# Patient Record
Sex: Male | Born: 1965 | Race: Black or African American | Hispanic: No | Marital: Married | Smoking: Never smoker
Health system: Southern US, Community
[De-identification: ages and names within clinical notes are randomized; demographics above are authoritative.]

## PROBLEM LIST (undated history)

## (undated) DIAGNOSIS — M62830 Muscle spasm of back: Secondary | ICD-10-CM

## (undated) DIAGNOSIS — M25519 Pain in unspecified shoulder: Secondary | ICD-10-CM

## (undated) HISTORY — PX: RETINAL TEAR REPAIR CRYOTHERAPY: SHX5304

## (undated) HISTORY — PX: SHOULDER SURGERY: SHX246

---

## 1999-12-29 ENCOUNTER — Encounter: Payer: Self-pay | Admitting: Emergency Medicine

## 1999-12-29 ENCOUNTER — Emergency Department (HOSPITAL_COMMUNITY): Admission: EM | Admit: 1999-12-29 | Discharge: 1999-12-29 | Payer: Self-pay | Admitting: Emergency Medicine

## 2009-06-16 ENCOUNTER — Emergency Department (HOSPITAL_COMMUNITY): Admission: EM | Admit: 2009-06-16 | Discharge: 2009-06-16 | Payer: Self-pay | Admitting: Emergency Medicine

## 2010-07-19 ENCOUNTER — Emergency Department (HOSPITAL_COMMUNITY)
Admission: EM | Admit: 2010-07-19 | Discharge: 2010-07-19 | Disposition: A | Discharge status: Home / Self Care | Payer: Self-pay | Attending: Emergency Medicine | Admitting: Emergency Medicine

## 2010-07-19 ENCOUNTER — Emergency Department (HOSPITAL_COMMUNITY): Payer: Self-pay

## 2010-07-19 DIAGNOSIS — H538 Other visual disturbances: Secondary | ICD-10-CM | POA: Insufficient documentation

## 2010-07-19 DIAGNOSIS — R0602 Shortness of breath: Secondary | ICD-10-CM | POA: Insufficient documentation

## 2010-07-19 DIAGNOSIS — F411 Generalized anxiety disorder: Secondary | ICD-10-CM | POA: Insufficient documentation

## 2010-07-19 DIAGNOSIS — R209 Unspecified disturbances of skin sensation: Secondary | ICD-10-CM | POA: Insufficient documentation

## 2010-07-19 DIAGNOSIS — I498 Other specified cardiac arrhythmias: Secondary | ICD-10-CM | POA: Insufficient documentation

## 2010-07-19 DIAGNOSIS — R0789 Other chest pain: Secondary | ICD-10-CM | POA: Insufficient documentation

## 2010-07-19 DIAGNOSIS — R110 Nausea: Secondary | ICD-10-CM | POA: Insufficient documentation

## 2010-07-19 LAB — BASIC METABOLIC PANEL
BUN: 11 mg/dL (ref 6–23)
Calcium: 9.6 mg/dL (ref 8.4–10.5)
Creatinine, Ser: 0.83 mg/dL (ref 0.4–1.5)
GFR calc non Af Amer: >60 mL/min (ref >60)
Glucose, Bld: 112 mg/dL — ABNORMAL HIGH (ref 70–99)
Potassium: 3.7 mEq/L (ref 3.5–5.1)

## 2010-07-19 LAB — CBC
HCT: 38.6 % — ABNORMAL LOW (ref 39.0–52.0)
Hemoglobin: 14 g/dL (ref 13.0–17.0)
MCH: 31.3 pg (ref 26.0–34.0)
MCHC: 36.3 g/dL — ABNORMAL HIGH (ref 30.0–36.0)
MCV: 86.4 fL (ref 78.0–100.0)
Platelets: 228 10*3/uL (ref 150–400)
RBC: 4.47 MIL/uL (ref 4.22–5.81)
RDW: 12.4 % (ref 11.5–15.5)
WBC: 6.7 10*3/uL (ref 4.0–10.5)

## 2010-07-19 LAB — DIFFERENTIAL
Basophils Absolute: 0 10*3/uL (ref 0.0–0.1)
Basophils Relative: 0 % (ref 0–1)
Eosinophils Absolute: 0.1 10*3/uL (ref 0.0–0.7)
Eosinophils Relative: 1 % (ref 0–5)
Lymphocytes Relative: 43 % (ref 12–46)
Lymphs Abs: 2.9 10*3/uL (ref 0.7–4.0)
Monocytes Absolute: 0.3 10*3/uL (ref 0.1–1.0)
Monocytes Relative: 5 % (ref 3–12)
Neutro Abs: 3.4 10*3/uL (ref 1.7–7.7)
Neutrophils Relative %: 51 % (ref 43–77)

## 2010-07-19 LAB — POCT CARDIAC MARKERS
CKMB, poc: <1 ng/mL — ABNORMAL LOW (ref 1.0–8.0)
Myoglobin, poc: 55.3 ng/mL (ref 12–200)
Troponin i, poc: <0.05 ng/mL (ref 0.00–0.09)
Troponin i, poc: <0.05 ng/mL (ref 0.00–0.09)

## 2013-01-10 ENCOUNTER — Other Ambulatory Visit (HOSPITAL_COMMUNITY): Payer: Self-pay | Admitting: Family Medicine

## 2013-01-10 DIAGNOSIS — M25512 Pain in left shoulder: Secondary | ICD-10-CM

## 2013-01-10 DIAGNOSIS — R531 Weakness: Secondary | ICD-10-CM

## 2013-01-15 ENCOUNTER — Ambulatory Visit (HOSPITAL_COMMUNITY)
Admission: RE | Admit: 2013-01-15 | Discharge: 2013-01-15 | Disposition: A | Discharge status: Home / Self Care | Payer: Self-pay | Source: Ambulatory Visit | Attending: Family Medicine | Admitting: Family Medicine

## 2013-01-15 DIAGNOSIS — S43429A Sprain of unspecified rotator cuff capsule, initial encounter: Secondary | ICD-10-CM | POA: Insufficient documentation

## 2013-01-15 DIAGNOSIS — M25512 Pain in left shoulder: Secondary | ICD-10-CM

## 2013-01-15 DIAGNOSIS — X58XXXA Exposure to other specified factors, initial encounter: Secondary | ICD-10-CM | POA: Insufficient documentation

## 2013-01-15 DIAGNOSIS — R531 Weakness: Secondary | ICD-10-CM

## 2014-06-20 ENCOUNTER — Emergency Department (HOSPITAL_COMMUNITY): Payer: Self-pay

## 2014-06-20 ENCOUNTER — Emergency Department (HOSPITAL_COMMUNITY)
Admission: EM | Admit: 2014-06-20 | Discharge: 2014-06-20 | Disposition: A | Discharge status: Home / Self Care | Payer: Self-pay | Attending: Emergency Medicine | Admitting: Emergency Medicine

## 2014-06-20 ENCOUNTER — Encounter (HOSPITAL_COMMUNITY): Payer: Self-pay | Admitting: Emergency Medicine

## 2014-06-20 DIAGNOSIS — F419 Anxiety disorder, unspecified: Secondary | ICD-10-CM | POA: Insufficient documentation

## 2014-06-20 DIAGNOSIS — R0602 Shortness of breath: Secondary | ICD-10-CM | POA: Insufficient documentation

## 2014-06-20 DIAGNOSIS — R61 Generalized hyperhidrosis: Secondary | ICD-10-CM | POA: Insufficient documentation

## 2014-06-20 DIAGNOSIS — R079 Chest pain, unspecified: Secondary | ICD-10-CM

## 2014-06-20 LAB — CBC
HCT: 38.6 % — ABNORMAL LOW (ref 39.0–52.0)
HEMOGLOBIN: 13.3 g/dL (ref 13.0–17.0)
MCH: 30.4 pg (ref 26.0–34.0)
MCHC: 34.5 g/dL (ref 30.0–36.0)
MCV: 88.3 fL (ref 78.0–100.0)
PLATELETS: 214 10*3/uL (ref 150–400)
RBC: 4.37 MIL/uL (ref 4.22–5.81)
RDW: 12.7 % (ref 11.5–15.5)
WBC: 5.6 10*3/uL (ref 4.0–10.5)

## 2014-06-20 LAB — BRAIN NATRIURETIC PEPTIDE: B NATRIURETIC PEPTIDE 5: 10.7 pg/mL (ref 0.0–100.0)

## 2014-06-20 LAB — I-STAT TROPONIN, ED: Troponin i, poc: 0 ng/mL (ref 0.00–0.08)

## 2014-06-20 LAB — BASIC METABOLIC PANEL
Anion gap: 7 (ref 5–15)
BUN: 13 mg/dL (ref 6–23)
CALCIUM: 9.3 mg/dL (ref 8.4–10.5)
CHLORIDE: 102 mmol/L (ref 96–112)
CO2: 29 mmol/L (ref 19–32)
CREATININE: 1.09 mg/dL (ref 0.50–1.35)
GFR calc Af Amer: >90 mL/min (ref >90)
GFR calc non Af Amer: 80 mL/min — ABNORMAL LOW (ref >90)
GLUCOSE: 130 mg/dL — AB (ref 70–99)
POTASSIUM: 3.9 mmol/L (ref 3.5–5.1)
SODIUM: 138 mmol/L (ref 135–145)

## 2014-06-20 NOTE — Discharge Instructions (Signed)

## 2014-06-20 NOTE — ED Notes (Signed)
Patient was seen by MD and told her was going to be discharged.  Upon entering the room patient was gone.

## 2014-06-20 NOTE — ED Notes (Signed)
Pt. reports intermittent central chest pain with SOB and nausea onset last week , denies  cough or congestion .

## 2014-06-20 NOTE — ED Provider Notes (Signed)
CSN: [id number]     Arrival date & time 06/20/14  2147 History   First MD Initiated Contact with Patient 06/20/14 2231     Chief Complaint  Patient presents with  . Chest Pain     (Consider location/radiation/quality/duration/timing/severity/associated sxs/prior Treatment) HPI  49 year old male presents with a history of approximate 30 minutes of chest pain. He states it started while he was working underneath his wife's car. He states he mostly felt shortness of breath with a little bit of sharp chest pain. Short of breath is similar to prior anxiety attacks in the past. He has anxiety issues daily, has severe anxiety issues about once per month like this. Given the chest pain, he became concerned and came to the ER. Patient denies any leg pain, leg swelling, or radiation of pain. Patient states the pain is been gone for about an hour. He now feels normal. He thinks this is all related to his anxiety. No prior history of hypertension, diabetes, hyperlipidemia, or smoking. His dad had an MI when he was 50.  History reviewed. No pertinent past medical history. Past Surgical History  Procedure Laterality Date  . Shoulder surgery     No family history on file. History  Substance Use Topics  . Smoking status: Never Smoker   . Smokeless tobacco: Not on file  . Alcohol Use: No    Review of Systems  Constitutional: Positive for diaphoresis. Negative for fever.  Respiratory: Positive for shortness of breath. Negative for cough.   Cardiovascular: Positive for chest pain. Negative for leg swelling.  Gastrointestinal: Negative for nausea, vomiting and abdominal pain.  Musculoskeletal: Negative for myalgias.  Psychiatric/Behavioral: The patient is nervous/anxious.   All other systems reviewed and are negative.     Allergies  Review of patient's allergies indicates no known allergies.  Home Medications   Prior to Admission medications   Medication Sig Start Date End Date Taking?  Authorizing Provider  HYDROcodone-acetaminophen (NORCO) 10-325 MG per tablet Take 1 tablet by mouth every 6 (six) hours as needed for moderate pain.   Yes Historical Provider, MD   BP 147/101 mmHg  Pulse 86  Temp(Src) 98.7 F (37.1 C) (Oral)  Resp 20  Ht 5\' 11"  (1.803 m)  Wt 175 lb (79.379 kg)  BMI 24.42 kg/m2  SpO2 100% Physical Exam  Constitutional: He is oriented to person, place, and time. He appears well-developed and well-nourished.  HENT:  Head: Normocephalic and atraumatic.  Right Ear: External ear normal.  Left Ear: External ear normal.  Nose: Nose normal.  Eyes: Right eye exhibits no discharge. Left eye exhibits no discharge.  Neck: Neck supple.  Cardiovascular: Normal rate, regular rhythm, normal heart sounds and intact distal pulses.   Pulmonary/Chest: Effort normal and breath sounds normal. He exhibits tenderness.    Abdominal: Soft. He exhibits no distension. There is no tenderness.  Musculoskeletal: He exhibits no edema.  Neurological: He is alert and oriented to person, place, and time.  Skin: Skin is warm and dry.  Nursing note and vitals reviewed.   ED Course  Procedures (including critical care time) Labs Review Labs Reviewed  CBC - Abnormal; Notable for the following:    HCT 38.6 (*)    All other components within normal limits  BASIC METABOLIC PANEL - Abnormal; Notable for the following:    Glucose, Bld 130 (*)    GFR calc non Af Amer 80 (*)    All other components within normal limits  BRAIN NATRIURETIC PEPTIDE  I-STAT  TROPOININ, ED    Imaging Review Dg Chest 2 View  06/20/2014   CLINICAL DATA:  49 year old male with new onset chest pain  EXAM: CHEST  2 VIEW  COMPARISON:  Prior chest x-ray 07/19/2010  FINDINGS: The lungs are clear and negative for focal airspace consolidation, pulmonary edema or suspicious pulmonary nodule. No pleural effusion or pneumothorax. Cardiac and mediastinal contours are within normal limits. No acute fracture or lytic  or blastic osseous lesions. The visualized upper abdominal bowel gas pattern is unremarkable.  IMPRESSION: Negative chest x-ray.   Electronically Signed   By: Malachy Moan M.D.   On: 06/20/2014 22:28     EKG Interpretation   Date/Time:  Thursday June 20 2014 21:52:34 EST Ventricular Rate:  92 PR Interval:  138 QRS Duration: 80 QT Interval:  396 QTC Calculation: 489 R Axis:   44 Text Interpretation:  Normal sinus rhythm Prolonged QT Abnormal ECG no  acute ST/T changes no significant change since 2012 Confirmed by Malvin Morrish   MD, Tiffany Calmes ([id number]) on 06/20/2014 10:31:46 PM      MDM   Final diagnoses:  Chest pain, unspecified chest pain type    Patient's symptoms sound consistent with anxiety versus very atypical chest pain. Initial EKG and troponin are normal. It is only been about 2 hours since his symptoms started, I recommended a second troponin to help rule out MI. At this point patient declines, stating he feels fine and wants to go home. I discussed the possibility of a missed MI and accommodation of this, including death. Patient understands these risks and wants to follow-up with his PCP tomorrow instead, stating he has a lot to do tomorrow. Given that I think this is low likelihood of ACS or feel is reasonable discharge with this plan, he will follow-up with PCP and will return if symptoms worsen.    Audree Camel, MD 06/20/14 [phone]

## 2014-06-20 NOTE — ED Notes (Signed)
Patient not in room when this nurse went to assess.  Clothing gone. Dr Criss AlvineGoldston notified.  Patient did no receive his discharge instructions

## 2014-11-11 ENCOUNTER — Emergency Department (HOSPITAL_COMMUNITY)
Admission: EM | Admit: 2014-11-11 | Discharge: 2014-11-11 | Disposition: A | Discharge status: Home / Self Care | Payer: Self-pay | Attending: Emergency Medicine | Admitting: Emergency Medicine

## 2014-11-11 ENCOUNTER — Encounter (HOSPITAL_COMMUNITY): Payer: Self-pay | Admitting: Family Medicine

## 2014-11-11 DIAGNOSIS — M5416 Radiculopathy, lumbar region: Secondary | ICD-10-CM | POA: Insufficient documentation

## 2014-11-11 DIAGNOSIS — R52 Pain, unspecified: Secondary | ICD-10-CM

## 2014-11-11 NOTE — Discharge Instructions (Signed)
Contact your primary care physician for follow-up and for further evaluation of your back pain and numbness.

## 2014-11-11 NOTE — ED Provider Notes (Addendum)
CSN: [id number]     Arrival date & time 11/11/14  1142 History   First MD Initiated Contact with Patient 11/11/14 1225     Chief Complaint  Patient presents with  . Leg Pain  . Numbness     (Consider location/radiation/quality/duration/timing/severity/associated sxs/prior Treatment) HPI Complains of right-sided paralumbar pain onset 3 days ago. Patient was not at work when pain began. He suffered a twisting injury while helping a friend. Pain radiates to right foot. Associated symptoms are He feels numbness in his right anterior thigh denies loss of bladder or bowel control. No fever. No direct trauma. He states he suffers similar pain every 2-3 months for the past year. He's been treated with Norco, with partial relief. No other associated symptoms History reviewed. No pertinent past medical history. Past Surgical History  Procedure Laterality Date  . Shoulder surgery     History reviewed. No pertinent family history. History  Substance Use Topics  . Smoking status: Never Smoker   . Smokeless tobacco: Not on file  . Alcohol Use: No   no illicit drug use  Review of Systems  Constitutional: Negative.   HENT: Negative.   Respiratory: Negative.   Cardiovascular: Negative.   Gastrointestinal: Negative.   Musculoskeletal: Positive for back pain.  Skin: Negative.   Neurological: Positive for numbness.  Psychiatric/Behavioral: Negative.   All other systems reviewed and are negative.     Allergies  Review of patient's allergies indicates no known allergies.  Home Medications   Prior to Admission medications   Medication Sig Start Date End Date Taking? Authorizing Provider  HYDROcodone-acetaminophen (NORCO) 10-325 MG per tablet Take 1 tablet by mouth every 6 (six) hours as needed for moderate pain.   Yes Historical Provider, MD   BP 134/89 mmHg  Pulse 82  Temp(Src) 98 F (36.7 C) (Oral)  Resp 22  SpO2 99% Physical Exam  Constitutional: He is oriented to person, place,  and time. He appears well-developed and well-nourished. No distress.  HENT:  Head: Normocephalic and atraumatic.  Eyes: Conjunctivae are normal. Pupils are equal, round, and reactive to light.  Neck: Neck supple. No tracheal deviation present. No thyromegaly present.  Cardiovascular: Normal rate and regular rhythm.   No murmur heard. Pulmonary/Chest: Effort normal and breath sounds normal.  Abdominal: Soft. Bowel sounds are normal. He exhibits no distension. There is no tenderness.  Musculoskeletal: Normal range of motion. He exhibits no edema or tenderness.  Tire spine nontender. He has pain at right paralumbar area when standing up from a supine position  Neurological: He is alert and oriented to person, place, and time. He has normal reflexes. Coordination normal.  Gait normal DTR symmetric bilaterally knee jerk ankle jerk biceps toes downward going bilaterally  Skin: Skin is warm and dry. No rash noted.  Psychiatric: He has a normal mood and affect.  Nursing note and vitals reviewed.   ED Course  Procedures (including critical care time) Labs Review Labs Reviewed - No data to display  Imaging Review No results found.   EKG Interpretation None      declines pain medicine presently  MDM   No signs of cauda equina syndrome. Patient may need MRI scan, non-emergently. He elected to apply for insurance and follow-up with primary care physician who will arrange for further evaluation Dx  lumbar radiculopathy Final diagnoses:  None        Doug Sou, MD 11/11/14 1443  Doug Sou, MD 11/11/14 1443

## 2014-11-11 NOTE — ED Notes (Signed)
Pt here for lower back pain and right leg numbness. sts no feeling in right thigh. sts work accident 3 days ago.

## 2014-11-19 ENCOUNTER — Encounter (HOSPITAL_COMMUNITY): Payer: Self-pay | Admitting: Emergency Medicine

## 2014-11-19 ENCOUNTER — Emergency Department (HOSPITAL_COMMUNITY)
Admission: EM | Admit: 2014-11-19 | Discharge: 2014-11-19 | Disposition: A | Discharge status: Home / Self Care | Payer: Self-pay | Attending: Emergency Medicine | Admitting: Emergency Medicine

## 2014-11-19 DIAGNOSIS — M5417 Radiculopathy, lumbosacral region: Secondary | ICD-10-CM | POA: Insufficient documentation

## 2014-11-19 DIAGNOSIS — Y92007 Garden or yard of unspecified non-institutional (private) residence as the place of occurrence of the external cause: Secondary | ICD-10-CM | POA: Insufficient documentation

## 2014-11-19 DIAGNOSIS — S61411A Laceration without foreign body of right hand, initial encounter: Secondary | ICD-10-CM | POA: Insufficient documentation

## 2014-11-19 DIAGNOSIS — W450XXA Nail entering through skin, initial encounter: Secondary | ICD-10-CM | POA: Insufficient documentation

## 2014-11-19 DIAGNOSIS — Y9389 Activity, other specified: Secondary | ICD-10-CM | POA: Insufficient documentation

## 2014-11-19 DIAGNOSIS — Y998 Other external cause status: Secondary | ICD-10-CM | POA: Insufficient documentation

## 2014-11-19 HISTORY — DX: Muscle spasm of back: M62.830

## 2014-11-19 HISTORY — DX: Pain in unspecified shoulder: M25.519

## 2014-11-19 MED ORDER — LIDOCAINE HCL 2 % IJ SOLN
10.0000 mL | Freq: Once | INTRAMUSCULAR | Status: AC
  Administered 2014-11-19: 200 mg via INTRADERMAL
  Filled 2014-11-19: qty 20

## 2014-11-19 MED ORDER — CYCLOBENZAPRINE HCL 10 MG PO TABS: 10.0000 mg | ORAL_TABLET | Freq: Two times a day (BID) | ORAL | Status: DC | PRN

## 2014-11-19 MED ORDER — NAPROXEN 500 MG PO TABS: 500.0000 mg | ORAL_TABLET | Freq: Two times a day (BID) | ORAL | Status: DC

## 2014-11-19 NOTE — ED Provider Notes (Signed)
CSN: [id number]     Arrival date & time 11/19/14  1200 History  This chart was scribed for non-physician practitioner Sherlene Shams, PA-C working with Rolland Porter, MD by Murriel Hopper, ED Scribe. This patient was seen in room WTR6/WTR6 and the patient's care was started at 12:52 PM.   Chief Complaint  Patient presents with  . Back Pain    acute low back pain  . Hand Injury    skin laceration, "rusty nail"    The history is provided by the patient. No language interpreter was used.   HPI Comments: Charles Dominguez is a 48 y.o. male who presents to the Emergency Department complaining of recurrent bilateral lower back pain with associated radiated pain down right bed and numbness that has been present for a few months. Pt states that every 4-6 weeks he has back pain and has not sought treatment for it yet. Pt states he cannot usually perform daily activities if his back is bothering him, and states that today he was working on a shed in his backyard, and hammering nails into the side of the shed. Pt states that his back had a spasm while he was hitting a nail, and caused his grip to slip and hammer the nail into the skin between his index finger and thumb on his right hand. Pt reports with a laceration and recurrent bleeding of the area.   No past medical history on file. Past Surgical History  Procedure Laterality Date  . Shoulder surgery     No family history on file. History  Substance Use Topics  . Smoking status: Never Smoker   . Smokeless tobacco: Not on file  . Alcohol Use: No    Review of Systems  Musculoskeletal: Positive for back pain and arthralgias.  Skin: Positive for wound.      Allergies  Review of patient's allergies indicates no known allergies.  Home Medications   Prior to Admission medications   Medication Sig Start Date End Date Taking? Authorizing Provider  HYDROcodone-acetaminophen (NORCO) 10-325 MG per tablet Take 1 tablet by mouth every 6  (six) hours as needed for moderate pain.    Historical Provider, MD   There were no vitals taken for this visit. Physical Exam  Constitutional: He is oriented to person, place, and time. He appears well-developed and well-nourished.  HENT:  Head: Normocephalic and atraumatic.  Neck: Neck supple.  Cardiovascular: Normal rate.   Pulmonary/Chest: Effort normal.  Abdominal: He exhibits no distension.  Musculoskeletal:  4 cm flap laceration in the web space between right thumb and index finger. Laceration appears to be superficial, gaping, no deep tissue involvement. Full range of motion, and all fingers in all directions, strength is 5/5  and equal at all joints. Sensation intact over all dermatomes of the hand and over dorsal and palmar surface of the thumb and index finger. Cap refill less than 2 seconds distally. No bony tenderness to the hand Midline and right perivertebral spine and muscle tenderness. Pain with right straight leg raise.  Neurological: He is alert and oriented to person, place, and time.  5/5 and equal lower extremity strength. 2+ and equal patellar reflexes bilaterally. Pt able to dorsiflex bilateral toes and feet with good strength against resistance. Equal sensation bilaterally over thighs and lower legs.   Skin: Skin is warm and dry.  Psychiatric: He has a normal mood and affect.  Nursing note and vitals reviewed.   ED Course  Procedures (including critical care time)  DIAGNOSTIC STUDIES: Oxygen Saturation is 100% on room air, normal by my interpretation.    COORDINATION OF CARE: 12:57 PM Discussed treatment plan with pt at bedside and pt agreed to plan.   Labs Review Labs Reviewed - No data to display  Imaging Review No results found.   EKG Interpretation None     LACERATION REPAIR Performed by: Lottie MusselKIRICHENKO, Lakai Moree A Authorized by: Jaynie CrumbleKIRICHENKO, Cordaro Mukai A Consent: Verbal consent obtained. Risks and benefits: risks, benefits and alternatives were  discussed Consent given by: patient Patient identity confirmed: provided demographic data Prepped and Draped in normal sterile fashion Wound explored  Laceration Location: right hand  Laceration Length: 4cm  No Foreign Bodies seen or palpated  Anesthesia: local infiltration  Local anesthetic: lidocaine 2% wo epinephrine  Anesthetic total: 2 ml  Irrigation method: syringe Amount of cleaning: standard  Skin closure: prolene 5.0  Number of sutures: 5  Technique: simple interrupted  Patient tolerance: Patient tolerated the procedure well with no immediate complications.  MDM   Final diagnoses:  Laceration of right hand, initial encounter  Lumbosacral radiculitis    Patient suffered laceration to the right hand. Laceration numbed and irrigated thoroughly, there is no soft tissue damage under the laceration. It appears to be superficial. There is no bony tenderness, full range of motion of all joints and normal sensation, did not get an x-ray. I do not think it's indicated in this patient. There is no concern for foreign body. His tetanus is up-to-date. No evidence of cauda equina on examination. Home with Flexeril for back pain, follow-up with neurosurgeon. Bacitracin for laceration, follow-up with 7 days.  Filed Vitals:   11/19/14 1241  BP: 134/100  Pulse: 83  Temp: 98.6 F (37 C)  TempSrc: Oral  Resp: 18  SpO2: 100%    I personally performed the services described in this documentation, which was scribed in my presence. The recorded information has been reviewed and is accurate.   Jaynie Crumbleatyana Lorenzo Pereyra, PA-C 11/19/14 2019  Rolland PorterMark James, MD 11/25/14 2046

## 2014-11-19 NOTE — ED Notes (Addendum)
Pt c/o low back spasms radiating down r/leg, at 1030. Pt stated that the spasm caused him to loose grip of a metal shed and a rusty nails lacerated inner aspect of r/hand at base of thumb. Bleeding controlled. 1cm laceration, and .5 shallow laceration on 2nd finger. Pt sprayed area with OTC pain med, denies pain in hand.

## 2014-11-19 NOTE — Discharge Instructions (Signed)
Keep cut clean. Bacitracin 3 times a day. Suture removal in 7 days. Continue pain medications. Naprosyn and flexeril as prescribed. Follow up with your doctor regarding back pain or with neurosurgery. Return if worsening.   Laceration Care, Adult A laceration is a cut or lesion that goes through all layers of the skin and into the tissue just beneath the skin. TREATMENT  Some lacerations may not require closure. Some lacerations may not be able to be closed due to an increased risk of infection. It is important to see your caregiver as soon as possible after an injury to minimize the risk of infection and maximize the opportunity for successful closure. If closure is appropriate, pain medicines may be given, if needed. The wound will be cleaned to help prevent infection. Your caregiver will use stitches (sutures), staples, wound glue (adhesive), or skin adhesive strips to repair the laceration. These tools bring the skin edges together to allow for faster healing and a better cosmetic outcome. However, all wounds will heal with a scar. Once the wound has healed, scarring can be minimized by covering the wound with sunscreen during the day for 1 full year. HOME CARE INSTRUCTIONS  For sutures or staples:  Keep the wound clean and dry.  If you were given a bandage (dressing), you should change it at least once a day. Also, change the dressing if it becomes wet or dirty, or as directed by your caregiver.  Wash the wound with soap and water 2 times a day. Rinse the wound off with water to remove all soap. Pat the wound dry with a clean towel.  After cleaning, apply a thin layer of the antibiotic ointment as recommended by your caregiver. This will help prevent infection and keep the dressing from sticking.  You may shower as usual after the first 24 hours. Do not soak the wound in water until the sutures are removed.  Only take over-the-counter or prescription medicines for pain, discomfort, or fever  as directed by your caregiver.  Get your sutures or staples removed as directed by your caregiver. For skin adhesive strips:  Keep the wound clean and dry.  Do not get the skin adhesive strips wet. You may bathe carefully, using caution to keep the wound dry.  If the wound gets wet, pat it dry with a clean towel.  Skin adhesive strips will fall off on their own. You may trim the strips as the wound heals. Do not remove skin adhesive strips that are still stuck to the wound. They will fall off in time. For wound adhesive:  You may briefly wet your wound in the shower or bath. Do not soak or scrub the wound. Do not swim. Avoid periods of heavy perspiration until the skin adhesive has fallen off on its own. After showering or bathing, gently pat the wound dry with a clean towel.  Do not apply liquid medicine, cream medicine, or ointment medicine to your wound while the skin adhesive is in place. This may loosen the film before your wound is healed.  If a dressing is placed over the wound, be careful not to apply tape directly over the skin adhesive. This may cause the adhesive to be pulled off before the wound is healed.  Avoid prolonged exposure to sunlight or tanning lamps while the skin adhesive is in place. Exposure to ultraviolet light in the first year will darken the scar.  The skin adhesive will usually remain in place for 5 to 10 days, then  naturally fall off the skin. Do not pick at the adhesive film. You may need a tetanus shot if:  You cannot remember when you had your last tetanus shot.  You have never had a tetanus shot. If you get a tetanus shot, your arm may swell, get red, and feel warm to the touch. This is common and not a problem. If you need a tetanus shot and you choose not to have one, there is a rare chance of getting tetanus. Sickness from tetanus can be serious. SEEK MEDICAL CARE IF:   You have redness, swelling, or increasing pain in the wound.  You see a red  line that goes away from the wound.  You have yellowish-white fluid (pus) coming from the wound.  You have a fever.  You notice a bad smell coming from the wound or dressing.  Your wound breaks open before or after sutures have been removed.  You notice something coming out of the wound such as wood or glass.  Your wound is on your hand or foot and you cannot move a finger or toe. SEEK IMMEDIATE MEDICAL CARE IF:   Your pain is not controlled with prescribed medicine.  You have severe swelling around the wound causing pain and numbness or a change in color in your arm, hand, leg, or foot.  Your wound splits open and starts bleeding.  You have worsening numbness, weakness, or loss of function of any joint around or beyond the wound.  You develop painful lumps near the wound or on the skin anywhere on your body. MAKE SURE YOU:   Understand these instructions.  Will watch your condition.  Will get help right away if you are not doing well or get worse. Document Released: 05/10/2005 Document Revised: 08/02/2011 Document Reviewed: 11/03/2010 [facility] Patient Information 2015 [place], [place]. This information is not intended to replace advice given to you by your health care provider. Make sure you discuss any questions you have with your health care provider.   Back Exercises These exercises may help you when beginning to rehabilitate your injury. Your symptoms may resolve with or without further involvement from your physician, physical therapist or athletic trainer. While completing these exercises, remember:   Restoring tissue flexibility helps normal motion to return to the joints. This allows healthier, less painful movement and activity.  An effective stretch should be held for at least 30 seconds.  A stretch should never be painful. You should only feel a gentle lengthening or release in the stretched tissue. STRETCH - Extension, Prone on Elbows   Lie on your stomach on  the floor, a bed will be too soft. Place your palms about shoulder width apart and at the height of your head.  Place your elbows under your shoulders. If this is too painful, stack pillows under your chest.  Allow your body to relax so that your hips drop lower and make contact more completely with the floor.  Hold this position for __________ seconds.  Slowly return to lying flat on the floor. Repeat __________ times. Complete this exercise __________ times per day.  RANGE OF MOTION - Extension, Prone Press Ups   Lie on your stomach on the floor, a bed will be too soft. Place your palms about shoulder width apart and at the height of your head.  Keeping your back as relaxed as possible, slowly straighten your elbows while keeping your hips on the floor. You may adjust the placement of your hands to maximize your comfort.  As you gain motion, your hands will come more underneath your shoulders.  Hold this position __________ seconds.  Slowly return to lying flat on the floor. Repeat __________ times. Complete this exercise __________ times per day.  RANGE OF MOTION- Quadruped, Neutral Spine   Assume a hands and knees position on a firm surface. Keep your hands under your shoulders and your knees under your hips. You may place padding under your knees for comfort.  Drop your head and point your tail bone toward the ground below you. This will round out your low back like an angry cat. Hold this position for __________ seconds.  Slowly lift your head and release your tail bone so that your back sags into a large arch, like an old horse.  Hold this position for __________ seconds.  Repeat this until you feel limber in your low back.  Now, find your "sweet spot." This will be the most comfortable position somewhere between the two previous positions. This is your neutral spine. Once you have found this position, tense your stomach muscles to support your low back.  Hold this position  for __________ seconds. Repeat __________ times. Complete this exercise __________ times per day.  STRETCH - Flexion, Single Knee to Chest   Lie on a firm bed or floor with both legs extended in front of you.  Keeping one leg in contact with the floor, bring your opposite knee to your chest. Hold your leg in place by either grabbing behind your thigh or at your knee.  Pull until you feel a gentle stretch in your low back. Hold __________ seconds.  Slowly release your grasp and repeat the exercise with the opposite side. Repeat __________ times. Complete this exercise __________ times per day.  STRETCH - Hamstrings, Standing  Stand or sit and extend your right / left leg, placing your foot on a chair or foot stool  Keeping a slight arch in your low back and your hips straight forward.  Lead with your chest and lean forward at the waist until you feel a gentle stretch in the back of your right / left knee or thigh. (When done correctly, this exercise requires leaning only a small distance.)  Hold this position for __________ seconds. Repeat __________ times. Complete this stretch __________ times per day. STRENGTHENING - Deep Abdominals, Pelvic Tilt   Lie on a firm bed or floor. Keeping your legs in front of you, bend your knees so they are both pointed toward the ceiling and your feet are flat on the floor.  Tense your lower abdominal muscles to press your low back into the floor. This motion will rotate your pelvis so that your tail bone is scooping upwards rather than pointing at your feet or into the floor.  With a gentle tension and even breathing, hold this position for __________ seconds. Repeat __________ times. Complete this exercise __________ times per day.  STRENGTHENING - Abdominals, Crunches   Lie on a firm bed or floor. Keeping your legs in front of you, bend your knees so they are both pointed toward the ceiling and your feet are flat on the floor. Cross your arms over  your chest.  Slightly tip your chin down without bending your neck.  Tense your abdominals and slowly lift your trunk high enough to just clear your shoulder blades. Lifting higher can put excessive stress on the low back and does not further strengthen your abdominal muscles.  Control your return to the starting position. Repeat __________  times. Complete this exercise __________ times per day.  STRENGTHENING - Quadruped, Opposite UE/LE Lift   Assume a hands and knees position on a firm surface. Keep your hands under your shoulders and your knees under your hips. You may place padding under your knees for comfort.  Find your neutral spine and gently tense your abdominal muscles so that you can maintain this position. Your shoulders and hips should form a rectangle that is parallel with the floor and is not twisted.  Keeping your trunk steady, lift your right hand no higher than your shoulder and then your left leg no higher than your hip. Make sure you are not holding your breath. Hold this position __________ seconds.  Continuing to keep your abdominal muscles tense and your back steady, slowly return to your starting position. Repeat with the opposite arm and leg. Repeat __________ times. Complete this exercise __________ times per day. Document Released: 05/28/2005 Document Revised: 08/02/2011 Document Reviewed: 08/22/2008 [facility] Patient Information 2015 [place], [place]. This information is not intended to replace advice given to you by your health care provider. Make sure you discuss any questions you have with your health care provider.

## 2016-06-17 ENCOUNTER — Encounter (HOSPITAL_COMMUNITY): Payer: Self-pay | Admitting: Emergency Medicine

## 2016-06-17 ENCOUNTER — Emergency Department (HOSPITAL_COMMUNITY)
Admission: EM | Admit: 2016-06-17 | Discharge: 2016-06-18 | Disposition: A | Discharge status: Other Acute Inpatient Hospital | Payer: Medicaid Other | Attending: Emergency Medicine | Admitting: Emergency Medicine

## 2016-06-17 DIAGNOSIS — F250 Schizoaffective disorder, bipolar type: Secondary | ICD-10-CM | POA: Diagnosis present

## 2016-06-17 DIAGNOSIS — R443 Hallucinations, unspecified: Secondary | ICD-10-CM

## 2016-06-17 DIAGNOSIS — Z79899 Other long term (current) drug therapy: Secondary | ICD-10-CM | POA: Insufficient documentation

## 2016-06-17 LAB — COMPREHENSIVE METABOLIC PANEL
ALBUMIN: 5.4 g/dL — AB (ref 3.5–5.0)
ALT: 87 U/L — ABNORMAL HIGH (ref 17–63)
ANION GAP: 7 (ref 5–15)
AST: 51 U/L — AB (ref 15–41)
Alkaline Phosphatase: 86 U/L (ref 38–126)
BUN: 18 mg/dL (ref 6–20)
CHLORIDE: 104 mmol/L (ref 101–111)
CO2: 27 mmol/L (ref 22–32)
Calcium: 9.6 mg/dL (ref 8.9–10.3)
Creatinine, Ser: 1.03 mg/dL (ref 0.61–1.24)
GFR calc Af Amer: >60 mL/min (ref >60)
Glucose, Bld: 97 mg/dL (ref 65–99)
POTASSIUM: 3.7 mmol/L (ref 3.5–5.1)
Sodium: 138 mmol/L (ref 135–145)
Total Bilirubin: 1 mg/dL (ref 0.3–1.2)
Total Protein: 8 g/dL (ref 6.5–8.1)

## 2016-06-17 LAB — RAPID URINE DRUG SCREEN, HOSP PERFORMED
AMPHETAMINES: NOT DETECTED
BARBITURATES: NOT DETECTED
BENZODIAZEPINES: POSITIVE — AB
COCAINE: NOT DETECTED
OPIATES: POSITIVE — AB
TETRAHYDROCANNABINOL: NOT DETECTED

## 2016-06-17 LAB — CBC
HEMATOCRIT: 40.6 % (ref 39.0–52.0)
HEMOGLOBIN: 13.9 g/dL (ref 13.0–17.0)
MCH: 30.7 pg (ref 26.0–34.0)
MCHC: 34.2 g/dL (ref 30.0–36.0)
MCV: 89.6 fL (ref 78.0–100.0)
Platelets: 245 10*3/uL (ref 150–400)
RBC: 4.53 MIL/uL (ref 4.22–5.81)
RDW: 13.5 % (ref 11.5–15.5)
WBC: 5.8 10*3/uL (ref 4.0–10.5)

## 2016-06-17 LAB — ETHANOL

## 2016-06-17 MED ORDER — NICOTINE 21 MG/24HR TD PT24: 21.0000 mg | MEDICATED_PATCH | Freq: Every day | TRANSDERMAL | Status: DC

## 2016-06-17 MED ORDER — ONDANSETRON HCL 4 MG PO TABS: 4.0000 mg | ORAL_TABLET | Freq: Three times a day (TID) | ORAL | Status: DC | PRN

## 2016-06-17 MED ORDER — ALPRAZOLAM 0.5 MG PO TABS
0.5000 mg | ORAL_TABLET | Freq: Three times a day (TID) | ORAL | Status: DC
  Administered 2016-06-17: 0.5 mg via ORAL
  Filled 2016-06-17: qty 1

## 2016-06-17 MED ORDER — ZOLPIDEM TARTRATE 5 MG PO TABS
5.0000 mg | ORAL_TABLET | Freq: Every evening | ORAL | Status: DC | PRN
  Administered 2016-06-18: 5 mg via ORAL
  Filled 2016-06-17: qty 1

## 2016-06-17 MED ORDER — SODIUM CHLORIDE 0.9 % IV BOLUS (SEPSIS)
1000.0000 mL | Freq: Once | INTRAVENOUS | Status: AC
  Administered 2016-06-17: 1000 mL via INTRAVENOUS

## 2016-06-17 MED ORDER — HYDROCODONE-ACETAMINOPHEN 10-325 MG PO TABS
1.0000 | ORAL_TABLET | Freq: Four times a day (QID) | ORAL | Status: DC | PRN
  Administered 2016-06-17 – 2016-06-18 (×3): 1 via ORAL
  Filled 2016-06-17 (×3): qty 1

## 2016-06-17 MED ORDER — IBUPROFEN 200 MG PO TABS
600.0000 mg | ORAL_TABLET | Freq: Three times a day (TID) | ORAL | Status: DC | PRN
  Administered 2016-06-18: 600 mg via ORAL
  Filled 2016-06-17: qty 3

## 2016-06-17 MED ORDER — ACETAMINOPHEN 325 MG PO TABS: 650.0000 mg | ORAL_TABLET | ORAL | Status: DC | PRN

## 2016-06-17 MED ORDER — ALUM & MAG HYDROXIDE-SIMETH 200-200-20 MG/5ML PO SUSP: 30.0000 mL | ORAL | Status: DC | PRN

## 2016-06-17 NOTE — ED Notes (Signed)
Bed: WA31 Expected date:  Expected time:  Means of arrival:  Comments: 

## 2016-06-17 NOTE — BH Assessment (Signed)
Under Review: [place]ma [place], [place], [place], [place], [address], [place], [place], [place], [facility], [place]

## 2016-06-17 NOTE — ED Notes (Signed)
Patient belongings placed in locker #31. 

## 2016-06-17 NOTE — BH Assessment (Addendum)
Assessment Note  Charles Dominguez is an 51 y.o. male with history of Bipolar Disorder and Schizophrenia. Per ED notes: "He was under involuntary commitment for hallucinations". The IVC papers state that patient has not slept in several days. He has a fixation on the "Rothschild family" and feels that they are out to get him. His wife reports that he took several Xanax along with sleeping medication last night. However, he was still unable to sleepSoftware engineer met with patient face to face. Patient believes that he was brought to [facility] because, "The Rothchild family and Mid[place] family are trying to send signals to him". He explains that one family is evil and the other is nice. Patient feels that he needs to get to the top of his roof at home to adjust signals. Sts, "My wife will not let me use the ladder to get on the roof.  He also points to a Band-Aid on his head and  states that the Rothschild's are trying to come through his veins in his brain. Patient is apparently experiencing delusions, auditory, and visual hallucinations. He denies any suicidal or homicidal ideation. No prior history of suicidal thoughts or attempts. No self mutilating behaviors. No history of aggressive or assaultive behaviors. No legal issues. Patient denies alcohol and drug use. He denies prior INPT and Outpatient mental health services.    Diagnosis: Schizophrenia Disorder and Bipolar Disorder  Past Medical History:  Past Medical History:  Diagnosis Date  . Back muscle spasm   . Shoulder pain     Past Surgical History:  Procedure Laterality Date  . RETINAL TEAR REPAIR CRYOTHERAPY     both eyes has surgery  . SHOULDER SURGERY      Family History: No family history on file.  Social History:  reports that he has never smoked. He does not have any smokeless tobacco history on file. He reports that he does not drink alcohol or use drugs.  Additional Social History:     CIWA: CIWA-Ar BP: 156/97 Pulse Rate:  115 COWS:    Allergies: No Known Allergies  Home Medications:  (Not in a hospital admission)  OB/GYN Status:  No LMP for male patient.  General Assessment Data [facility] of Assessment: [facility] TTS Assessment: In system Is this a Tele or Face-to-Face Assessment?: Face-to-Face Is this an Initial Assessment or a Re-assessment for this encounter?: Initial Assessment Marital status: Other (comment) Maiden name:  (n/a) Is patient pregnant?: No Pregnancy Status: No Living Arrangements: Spouse/significant other (spouse and daughter) Can pt return to current living arrangement?: Yes Admission Status: Involuntary Is patient capable of signing voluntary admission?: Yes Referral Source: Self/Family/Friend Insurance type:  (self pay )     Crisis Care Plan Living Arrangements: Spouse/significant other (spouse and daughter) Legal Guardian: Other: (no legal guardian ) Name of Psychiatrist: no psychiatrist  Name of Therapist: no therapist   Education Status Is patient currently in school?: No Current Grade:  (n/a) Highest grade of school patient has completed:  (10th grade ) Name of school:  (n/a) Contact person:  (n/a)  Risk to self with the past 6 months Suicidal Ideation: No Has patient been a risk to self within the past 6 months prior to admission? : No Suicidal Intent: No Has patient had any suicidal intent within the past 6 months prior to admission? : Yes Is patient at risk for suicide?: No Suicidal Plan?:  ("I was trying to take pills so"The [place] family" cant't) Has patient had any suicidal  plan within the past 6 months prior to admission? : No Access to Means: Yes Specify Access to Suicidal Means:  ("Xanax and Norro") What has been your use of drugs/alcohol within the last 12 months?:  (denies ) Previous Attempts/Gestures: No How many times?:  (0) Other Self Harm Risks:  (denies) Triggers for Past Attempts: Other (Comment) (denies previous attempts or gestures  ) Intentional Self Injurious Behavior: None Family Suicide History: No Recent stressful life event(s): Other (Comment) ("The Rockchild family is trying to get to me") Persecutory voices/beliefs?: Yes Depression: Yes Depression Symptoms: Feeling angry/irritable Substance abuse history and/or treatment for substance abuse?: No Suicide prevention information given to non-admitted patients: Not applicable  Risk to Others within the past 6 months Homicidal Ideation: No Does patient have any lifetime risk of violence toward others beyond the six months prior to admission? : No Thoughts of Harm to Others: No Current Homicidal Intent: No Current Homicidal Plan: No Access to Homicidal Means: No Identified Victim:  (n/a) History of harm to others?: No Assessment of Violence: None Noted Violent Behavior Description:  (patient is calm and cooperative ) Does patient have access to weapons?: No Criminal Charges Pending?: No Does patient have a court date: No Is patient on probation?: No  Psychosis Hallucinations: Auditory, Visual (Auditory; "I hear the Mid[place] and Rockchild Family.Marland Kitchen) Delusions: Unspecified ("They want me to climb on top of my rood.Marland KitchenMarland KitchenI see the signals)  Mental Status Report Appearance/Hygiene: In scrubs Eye Contact: Good Motor Activity: Freedom of movement Speech: Logical/coherent Level of Consciousness: Alert Mood: Depressed, Anxious Affect: Appropriate to circumstance Anxiety Level: Moderate Thought Processes: Relevant, Coherent Judgement: Impaired Orientation: Person, Place, Time, Situation Obsessive Compulsive Thoughts/Behaviors: None  Cognitive Functioning Concentration: Decreased Memory: Recent Intact, Remote Intact IQ: Average Insight: Poor Impulse Control: Poor Appetite: Good Weight Loss:  (none reported) Weight Gain:  (none reported) Sleep: Decreased Total Hours of Sleep:  ("No sleep in 2 days") Vegetative Symptoms: None  ADLScreening [facility]  Assessment Services) Patient's cognitive ability adequate to safely complete daily activities?: Yes Patient able to express need for assistance with ADLs?: Yes Independently performs ADLs?: Yes (appropriate for developmental age)  Prior Inpatient Therapy Prior Inpatient Therapy: No Prior Therapy Dates:  (n/a) Prior Therapy Facilty/Provider(s):  (n/a) Reason for Treatment:  (n/a)  Prior Outpatient Therapy Prior Outpatient Therapy: No Prior Therapy Dates:  (n/a) Prior Therapy Facilty/Provider(s):  (n/a) Reason for Treatment:  (n/a) Does patient have an ACCT team?: No Does patient have Intensive In-House Services?  : No Does patient have [facility] services? : No Does patient have P4CC services?: No  ADL Screening (condition at time of admission) Patient's cognitive ability adequate to safely complete daily activities?: Yes Is the patient deaf or have difficulty hearing?: No Does the patient have difficulty seeing, even when wearing glasses/contacts?: No Does the patient have difficulty concentrating, remembering, or making decisions?: Yes Patient able to express need for assistance with ADLs?: Yes Does the patient have difficulty dressing or bathing?: No Independently performs ADLs?: Yes (appropriate for developmental age) Does the patient have difficulty walking or climbing stairs?: No Weakness of Legs: None Weakness of Arms/Hands: None  Home Assistive Devices/Equipment Home Assistive Devices/Equipment: None    Abuse/Neglect Assessment (Assessment to be complete while patient is alone) Physical Abuse: Denies Verbal Abuse: Denies Sexual Abuse: Denies Exploitation of patient/patient's resources: Denies Self-Neglect: Denies Values / Beliefs Cultural Requests During Hospitalization: None Spiritual Requests During Hospitalization: None   Advance Directives (For Healthcare) Does Patient Have a Medical Advance Directive?: No  Would patient like information on creating a medical  advance directive?: No - Patient declined Nutrition Screen- [place] [facility] Patient's home diet: Regular  Additional Information 1:1 In Past 12 Months?: No CIRT Risk: No Elopement Risk: No Does patient have medical clearance?: Yes     Disposition:  Disposition Initial Assessment Completed for this Encounter: Yes  On Site Evaluation by:   Reviewed with Physician:     Waldon Merl 06/17/2016 1:11 PM

## 2016-06-17 NOTE — ED Triage Notes (Addendum)
Patient BIB [facility] from home. IVC paperwork states "The respondent has been diagnosed as bi-polar. The respondent has been prescribed zanax and norro. On last night, the respondent took six sleeping pills with his zanax because he had not slept for more than 15 hours last week. The respondent complains of voices are in his head so he wears different colored socks to hear what they are saying so he stays awake. The respondent is seeing spiders telling him they are everwhere and he is scratching and pacing the floor trying to kill them. The respondent has become increasing agitated and hostile. In the respondents present condition he is a danger to himself and to others."  Patient calm and cooperative. Denies SI/HI.

## 2016-06-17 NOTE — ED Notes (Signed)
ED Provider at bedside. 

## 2016-06-17 NOTE — Progress Notes (Addendum)
ED CM left pt uninsured [place] resources in his locker #31 CM spoke with pt who [place]cp.  CM provided written information to assist pt with determining choice for uninsured accepting pcps, discussed the importance of pcp vs EDP services for f/u care, www.needymeds.org, www.goodrx.com, discounted pharmacies and other [place] resources such as [facility] , [facility], affordable care act, financial assistance, uninsured dental services,  med assist, DSS and  health department  Provided resources for [place] uninsured accepting pcps like [facility], family medicine at [address], [facility], [facility], local urgent care centers, [facility], [facility], general medical clinics, family services of the [facility], [facility] urgent care plus others, medication resources, [facility] out patient pharmacies and housing Provided [facility] contact information   Entered in d/c instructions  Please use the resources provided to you in emergency room by case manager to assist in your choice of doctor for follow up  These [place] uninsured resources provide possible primary care providers, resources for discounted medications, housing, dental resources, affordable care act information, plus other resources for [place]

## 2016-06-17 NOTE — ED Notes (Signed)
TTS at bedside. 

## 2016-06-17 NOTE — ED Provider Notes (Signed)
[facility]-EM[facility]ENCY DEPT Provider Note   CSN: [id number] Arrival date & time: 06/17/16  1122     History   Chief Complaint Chief Complaint  Patient presents with  . IVC  . Hallucinations    HPI Charles Dominguez is a 51 y.o. male with past medical history of bipolar disorder and schizophrenia. He was under involuntary commitment for hallucinations. According to the paperwork. The patient has not slept in several days. He has a fixation on the [place] family and feels that they are out to get him. His wife reports that he took several Xanax along with sleeping medication last night. However, he was still unable to sleep. The patient points to a Band-Aid on his head and an states that the Rothschild's are trying to come through his veins in his brain. He denies any suicidal or homicidal ideation. Marland KitchenHPI  Past Medical History:  Diagnosis Date  . Back muscle spasm   . Shoulder pain     There are no active problems to display for this patient.   Past Surgical History:  Procedure Laterality Date  . RETINAL TEAR REPAIR CRYOTHERAPY     both eyes has surgery  . SHOULDER SURGERY         Home Medications    Prior to Admission medications   Medication Sig Start Date End Date Taking? Authorizing Provider  ALPRAZolam Prudy Feeler) 0.5 MG tablet Take 0.5 mg by mouth at bedtime as needed for anxiety.   Yes Historical Provider, MD  HYDROcodone-acetaminophen (NORCO) 10-325 MG per tablet Take 1 tablet by mouth every 6 (six) hours as needed for moderate pain.   Yes Historical Provider, MD  cyclobenzaprine (FLEXERIL) 10 MG tablet Take 1 tablet (10 mg total) by mouth 2 (two) times daily as needed for muscle spasms. 11/19/14   Tatyana Kirichenko, PA-C  naproxen (NAPROSYN) 500 MG tablet Take 1 tablet (500 mg total) by mouth 2 (two) times daily. 11/19/14   Jaynie Crumble, PA-C    Family History No family history on file.  Social History Social History  Substance Use Topics  . Smoking  status: Never Smoker  . Smokeless tobacco: Not on file  . Alcohol use No     Allergies   Patient has no known allergies.   Review of Systems Review of Systems Unable to review systems due to mental disorder  Physical Exam Updated Vital Signs BP 156/97 (BP Location: Left Arm)   Pulse 115   Temp 99 F (37.2 C) (Oral)   Resp 18   SpO2 100%   Physical Exam  Constitutional: He appears well-developed and well-nourished. No distress.  HENT:  Head: Normocephalic and atraumatic.  Eyes: Conjunctivae are normal. No scleral icterus.  Neck: Normal range of motion. Neck supple.  Cardiovascular: Normal rate, regular rhythm and normal heart sounds.   Pulmonary/Chest: Effort normal and breath sounds normal. No respiratory distress.  Abdominal: Soft. There is no tenderness.  Musculoskeletal: He exhibits no edema.  Neurological: He is alert.  Skin: Skin is warm and dry. He is not diaphoretic.  Poor eye contact. Speech is clear and lucid. He is a paranoid delusion and fixation.  Psychiatric: His behavior is normal.  Nursing note and vitals reviewed.    ED Treatments / Results  Labs (all labs ordered are listed, but only abnormal results are displayed) Labs Reviewed  COMPREHENSIVE METABOLIC PANEL  ETHANOL  CBC  RAPID URINE DRUG SCREEN, HOSP PERFORMED    EKG  EKG Interpretation None  Radiology No results found.  Procedures Procedures (including critical care time)  Medications Ordered in ED Medications  alum & mag hydroxide-simeth (MAALOX/MYLANTA) 200-200-20 MG/5ML suspension 30 mL (not administered)  ondansetron (ZOFRAN) tablet 4 mg (not administered)  nicotine (NICODERM CQ - dosed in mg/24 hours) patch 21 mg (not administered)  zolpidem (AMBIEN) tablet 5 mg (not administered)  acetaminophen (TYLENOL) tablet 650 mg (not administered)  ibuprofen (ADVIL,MOTRIN) tablet 600 mg (not administered)     Initial Impression / Assessment and Plan / ED Course  I have  reviewed the triage vital signs and the nursing notes.  Pertinent labs & imaging results that were available during my care of the patient were reviewed by me and considered in my medical decision making (see chart for details).      medically clear, Slight elevation in transaminases  Final Clinical Impressions(s) / ED Diagnoses   Final diagnoses:  None    New Prescriptions New Prescriptions   No medications on file     Arthor Captainbigail Chaundra Abreu, PA-C 06/17/16 2036    Jacalyn LefevreJulie Haviland, MD 06/18/16 [phone]

## 2016-06-17 NOTE — ED Notes (Signed)
Wife at bedside.

## 2016-06-18 ENCOUNTER — Encounter (HOSPITAL_COMMUNITY): Payer: Self-pay

## 2016-06-18 ENCOUNTER — Other Ambulatory Visit: Payer: Self-pay

## 2016-06-18 ENCOUNTER — Inpatient Hospital Stay (HOSPITAL_COMMUNITY)
Admission: AD | Admit: 2016-06-18 | Discharge: 2016-06-23 | DRG: 885 | Disposition: A | Discharge status: Home / Self Care | Payer: Medicaid Other | Attending: Emergency Medicine | Admitting: Emergency Medicine

## 2016-06-18 DIAGNOSIS — F3164 Bipolar disorder, current episode mixed, severe, with psychotic features: Secondary | ICD-10-CM | POA: Diagnosis present

## 2016-06-18 DIAGNOSIS — R55 Syncope and collapse: Secondary | ICD-10-CM | POA: Diagnosis present

## 2016-06-18 DIAGNOSIS — Z9889 Other specified postprocedural states: Secondary | ICD-10-CM

## 2016-06-18 DIAGNOSIS — W1839XA Other fall on same level, initial encounter: Secondary | ICD-10-CM | POA: Diagnosis not present

## 2016-06-18 DIAGNOSIS — G8929 Other chronic pain: Secondary | ICD-10-CM | POA: Diagnosis present

## 2016-06-18 DIAGNOSIS — Z79899 Other long term (current) drug therapy: Secondary | ICD-10-CM

## 2016-06-18 DIAGNOSIS — F1390 Sedative, hypnotic, or anxiolytic use, unspecified, uncomplicated: Secondary | ICD-10-CM | POA: Diagnosis present

## 2016-06-18 DIAGNOSIS — F419 Anxiety disorder, unspecified: Secondary | ICD-10-CM | POA: Diagnosis present

## 2016-06-18 DIAGNOSIS — S0181XA Laceration without foreign body of other part of head, initial encounter: Secondary | ICD-10-CM | POA: Diagnosis not present

## 2016-06-18 DIAGNOSIS — F1120 Opioid dependence, uncomplicated: Secondary | ICD-10-CM | POA: Diagnosis not present

## 2016-06-18 DIAGNOSIS — Y92230 Patient room in hospital as the place of occurrence of the external cause: Secondary | ICD-10-CM | POA: Diagnosis not present

## 2016-06-18 DIAGNOSIS — Z818 Family history of other mental and behavioral disorders: Secondary | ICD-10-CM | POA: Diagnosis not present

## 2016-06-18 DIAGNOSIS — Z915 Personal history of self-harm: Secondary | ICD-10-CM | POA: Diagnosis not present

## 2016-06-18 DIAGNOSIS — I4581 Long QT syndrome: Secondary | ICD-10-CM | POA: Diagnosis present

## 2016-06-18 DIAGNOSIS — Z560 Unemployment, unspecified: Secondary | ICD-10-CM

## 2016-06-18 DIAGNOSIS — F250 Schizoaffective disorder, bipolar type: Secondary | ICD-10-CM | POA: Diagnosis present

## 2016-06-18 DIAGNOSIS — F1310 Sedative, hypnotic or anxiolytic abuse, uncomplicated: Secondary | ICD-10-CM | POA: Diagnosis not present

## 2016-06-18 DIAGNOSIS — N289 Disorder of kidney and ureter, unspecified: Secondary | ICD-10-CM | POA: Diagnosis present

## 2016-06-18 DIAGNOSIS — I952 Hypotension due to drugs: Secondary | ICD-10-CM | POA: Diagnosis not present

## 2016-06-18 DIAGNOSIS — G4700 Insomnia, unspecified: Secondary | ICD-10-CM | POA: Diagnosis present

## 2016-06-18 MED ORDER — LORAZEPAM 1 MG PO TABS: 1.0000 mg | ORAL_TABLET | Freq: Four times a day (QID) | ORAL | Status: DC | PRN

## 2016-06-18 MED ORDER — LORAZEPAM 1 MG PO TABS
1.0000 mg | ORAL_TABLET | Freq: Four times a day (QID) | ORAL | Status: DC | PRN
  Administered 2016-06-18: 1 mg via ORAL
  Filled 2016-06-18: qty 1

## 2016-06-18 MED ORDER — HALOPERIDOL 1 MG PO TABS
1.0000 mg | ORAL_TABLET | Freq: Two times a day (BID) | ORAL | Status: DC
  Administered 2016-06-18 – 2016-06-19 (×2): 1 mg via ORAL
  Filled 2016-06-18 (×2): qty 1
  Filled 2016-06-18 (×2): qty 2
  Filled 2016-06-18 (×2): qty 1

## 2016-06-18 MED ORDER — GABAPENTIN 300 MG PO CAPS
300.0000 mg | ORAL_CAPSULE | Freq: Three times a day (TID) | ORAL | Status: DC
  Filled 2016-06-18 (×5): qty 1

## 2016-06-18 MED ORDER — GABAPENTIN 300 MG PO CAPS: 300.0000 mg | ORAL_CAPSULE | Freq: Three times a day (TID) | ORAL | Status: DC

## 2016-06-18 MED ORDER — MAGNESIUM HYDROXIDE 400 MG/5ML PO SUSP: 30.0000 mL | Freq: Every day | ORAL | Status: DC | PRN

## 2016-06-18 MED ORDER — NICOTINE 21 MG/24HR TD PT24: 21.0000 mg | MEDICATED_PATCH | Freq: Every day | TRANSDERMAL | Status: DC

## 2016-06-18 MED ORDER — CLONIDINE HCL 0.1 MG PO TABS
0.1000 mg | ORAL_TABLET | Freq: Three times a day (TID) | ORAL | Status: DC | PRN
  Administered 2016-06-19: 0.1 mg via ORAL
  Filled 2016-06-18: qty 1

## 2016-06-18 MED ORDER — ONDANSETRON HCL 4 MG PO TABS: 4.0000 mg | ORAL_TABLET | Freq: Three times a day (TID) | ORAL | Status: DC | PRN

## 2016-06-18 MED ORDER — TRAZODONE HCL 50 MG PO TABS
50.0000 mg | ORAL_TABLET | Freq: Every evening | ORAL | Status: DC | PRN
  Administered 2016-06-18: 50 mg via ORAL
  Filled 2016-06-18 (×4): qty 1

## 2016-06-18 MED ORDER — DICYCLOMINE HCL 20 MG PO TABS: 20.0000 mg | ORAL_TABLET | Freq: Four times a day (QID) | ORAL | Status: DC | PRN

## 2016-06-18 MED ORDER — GABAPENTIN 300 MG PO CAPS
300.0000 mg | ORAL_CAPSULE | Freq: Three times a day (TID) | ORAL | Status: DC
  Administered 2016-06-18: 300 mg via ORAL
  Filled 2016-06-18: qty 1

## 2016-06-18 MED ORDER — LOPERAMIDE HCL 2 MG PO CAPS: 2.0000 mg | ORAL_CAPSULE | ORAL | Status: DC | PRN

## 2016-06-18 MED ORDER — GABAPENTIN 400 MG PO CAPS
400.0000 mg | ORAL_CAPSULE | Freq: Three times a day (TID) | ORAL | Status: DC
  Administered 2016-06-18 – 2016-06-23 (×15): 400 mg via ORAL
  Filled 2016-06-18 (×6): qty 1
  Filled 2016-06-18: qty 21
  Filled 2016-06-18: qty 1
  Filled 2016-06-18: qty 21
  Filled 2016-06-18 (×3): qty 1
  Filled 2016-06-18: qty 21
  Filled 2016-06-18 (×12): qty 1

## 2016-06-18 MED ORDER — METHOCARBAMOL 500 MG PO TABS: 500.0000 mg | ORAL_TABLET | Freq: Three times a day (TID) | ORAL | Status: DC | PRN

## 2016-06-18 MED ORDER — HYDROXYZINE HCL 25 MG PO TABS
25.0000 mg | ORAL_TABLET | Freq: Four times a day (QID) | ORAL | Status: DC | PRN
  Administered 2016-06-18: 25 mg via ORAL
  Filled 2016-06-18: qty 1

## 2016-06-18 MED ORDER — ALUM & MAG HYDROXIDE-SIMETH 200-200-20 MG/5ML PO SUSP: 30.0000 mL | ORAL | Status: DC | PRN

## 2016-06-18 MED ORDER — BENZTROPINE MESYLATE 0.5 MG PO TABS
0.5000 mg | ORAL_TABLET | Freq: Two times a day (BID) | ORAL | Status: DC
  Administered 2016-06-18: 0.5 mg via ORAL
  Filled 2016-06-18: qty 1

## 2016-06-18 MED ORDER — ACETAMINOPHEN 325 MG PO TABS: 650.0000 mg | ORAL_TABLET | ORAL | Status: DC | PRN

## 2016-06-18 MED ORDER — IBUPROFEN 800 MG PO TABS
800.0000 mg | ORAL_TABLET | Freq: Three times a day (TID) | ORAL | Status: DC | PRN
  Administered 2016-06-19: 800 mg via ORAL
  Filled 2016-06-18: qty 1

## 2016-06-18 MED ORDER — BENZTROPINE MESYLATE 0.5 MG PO TABS
0.5000 mg | ORAL_TABLET | Freq: Two times a day (BID) | ORAL | Status: DC
  Administered 2016-06-18 – 2016-06-23 (×10): 0.5 mg via ORAL
  Filled 2016-06-18 (×12): qty 1
  Filled 2016-06-18: qty 14
  Filled 2016-06-18 (×2): qty 1
  Filled 2016-06-18: qty 14
  Filled 2016-06-18 (×2): qty 1

## 2016-06-18 MED ORDER — IBUPROFEN 600 MG PO TABS: 600.0000 mg | ORAL_TABLET | Freq: Three times a day (TID) | ORAL | Status: DC | PRN

## 2016-06-18 MED ORDER — LIDOCAINE 5 % EX PTCH
1.0000 | MEDICATED_PATCH | CUTANEOUS | Status: DC
  Administered 2016-06-18 – 2016-06-22 (×5): 1 via TRANSDERMAL
  Filled 2016-06-18 (×4): qty 1
  Filled 2016-06-18: qty 7
  Filled 2016-06-18 (×4): qty 1

## 2016-06-18 MED ORDER — HALOPERIDOL 1 MG PO TABS
1.0000 mg | ORAL_TABLET | Freq: Two times a day (BID) | ORAL | Status: DC
  Administered 2016-06-18: 1 mg via ORAL
  Filled 2016-06-18: qty 1

## 2016-06-18 MED ORDER — ZOLPIDEM TARTRATE 5 MG PO TABS: 5.0000 mg | ORAL_TABLET | Freq: Every day | ORAL | Status: DC

## 2016-06-18 NOTE — BH Assessment (Signed)
[facility] Assessment Progress Note  Per Charles MinsMojeed Akintayo, MD, this pt requires psychiatric hospitalization.  Charles Heinrichina Tate, RN, [facility] Charles Dominguez Rm [id number].  Pt presents under IVC initiated by his spouse, and upheld by Dr Charles Dominguez, and [facility]  Pt's nurse, Charles BeechamCynthia, has been notified, and agrees to call report to [phone].  Pt is to be transported via [employer] enforcement.   Doylene Canninghomas Kvon Mcilhenny, MA Triage Specialist [phone]

## 2016-06-18 NOTE — Progress Notes (Signed)
Talked with May, NP, about Gelene MinkFrederick taking Norco at home.  New order was obtained for increase in  Ibuprofen and no order for Norco at this time.  We will continue to monitor the progress towards his goals.

## 2016-06-18 NOTE — Progress Notes (Signed)
Patient reports getting sick, nauseous, sweats when he does not take his norco.  Spoke with Renata Capriceonrad, NP, received order for PRN clonidine, COWS assessment, and PRN's for withdrawal symptoms.

## 2016-06-18 NOTE — ED Notes (Signed)
[facility]  Nurses were not able to take report at this time.  They will call back at 1pm.

## 2016-06-18 NOTE — Consult Note (Signed)
[place] Psychiat[facility] Consult   Reason for Consult:  Delusions  Referring Physician:  EDP Patient Identification: Charles Dominguez MRN:  [id number] Principal Diagnosis: Schizoaffective disorder, bipolar type [facility]) Diagnosis:   Patient Active Problem List   Diagnosis Date Noted  . Schizoaffective disorder, bipolar type ([place]) [F25.0] 06/18/2016    Priority: High    Total Time spent with patient: 45 minutes  Subjective:   Charles Dominguez is a 51 y.o. male patient admitted with psychosis.  HPI:  On admission:  51 y.o. male with history of Bipolar Disorder and Schizophrenia. Per ED notes: "He was under involuntary commitment for hallucinations". The IVC papers state that patient has not slept in several days. He has a fixation on the "Rothschild family" and feels that they are out to get him.His wife reports that he took several Xanax along with sleeping medication last night. However, he was still unable to sleepSoftware engineer met with patient face to face. Patient believes that he was brought to [facility] because, "The [place]child family and [place] family are trying to send signals to him". He explains that one family is evil and the other is nice. Patient feels that he needs to get to the top of his roof at home to adjust signals. Sts, "My wife will not let me use the ladder to get on the roof.  He also points to a Band-Aid on his head and  states that the Rothschild's are trying to come through his veins in his brain. Patient is apparently experiencing delusions, auditory, and visual hallucinations. He denies any suicidal or homicidal ideation. No prior history of suicidal thoughts or attempts. No self mutilating behaviors. No history of aggressive or assaultive behaviors. No legal issues. Patient denies alcohol and drug use. He denies prior INPT and Outpatient mental health services.  Today, he continues with delusions of a drone being placed in his hand by the Rothchilds to control  him.  Pleasantly psychotic and engaging.    Past Psychiatric History: schizoaffective disorder, bipolar type  Risk to Self: Suicidal Ideation: No Suicidal Intent: No Is patient at risk for suicide?: No Suicidal Plan?:  ("I was trying to take pills so"The [place] family" cant't) Access to Means: Yes Specify Access to Suicidal Means:  ("Xanax and Norro") What has been your use of drugs/alcohol within the last 12 months?:  (denies ) How many times?:  (0) Other Self Harm Risks:  (denies) Triggers for Past Attempts: Other (Comment) (denies previous attempts or gestures ) Intentional Self Injurious Behavior: None Risk to Others: Homicidal Ideation: No Thoughts of Harm to Others: No Current Homicidal Intent: No Current Homicidal Plan: No Access to Homicidal Means: No Identified Victim:  (n/a) History of harm to others?: No Assessment of Violence: None Noted Violent Behavior Description:  (patient is calm and cooperative ) Does patient have access to weapons?: No Criminal Charges Pending?: No Does patient have a court date: No Prior Inpatient Therapy: Prior Inpatient Therapy: No Prior Therapy Dates:  (n/a) Prior Therapy Facilty/Provider(s):  (n/a) Reason for Treatment:  (n/a) Prior Outpatient Therapy: Prior Outpatient Therapy: No Prior Therapy Dates:  (n/a) Prior Therapy Facilty/Provider(s):  (n/a) Reason for Treatment:  (n/a) Does patient have an ACCT team?: No Does patient have Intensive In-House Services?  : No Does patient have [facility] services? : No Does patient have P4CC services?: No  Past Medical History:  Past Medical History:  Diagnosis Date  . Back muscle spasm   . Shoulder pain  Past Surgical History:  Procedure Laterality Date  . RETINAL TEAR REPAIR CRYOTHERAPY     both eyes has surgery  . SHOULDER SURGERY     Family History: No family history on file. Family Psychiatric  History: none Social History:  History  Alcohol Use No     History  Drug Use  No    Social History   Social History  . Marital status: Married    Spouse name: N/A  . Number of children: N/A  . Years of education: N/A   Social History Main Topics  . Smoking status: Never Smoker  . Smokeless tobacco: None  . Alcohol use No  . Drug use: No  . Sexual activity: Not Asked   Other Topics Concern  . None   Social History Narrative  . None   Additional Social History:    Allergies:  No Known Allergies  Labs:  Results for orders placed or performed during the hospital encounter of 06/17/16 (from the past 48 hour(s))  Rapid urine drug screen (hospital performed)     Status: Abnormal   Collection Time: 06/17/16 11:34 AM  Result Value Ref Range   Opiates POSITIVE (A) NONE DETECTED   Cocaine NONE DETECTED NONE DETECTED   Benzodiazepines POSITIVE (A) NONE DETECTED   Amphetamines NONE DETECTED NONE DETECTED   Tetrahydrocannabinol NONE DETECTED NONE DETECTED   Barbiturates NONE DETECTED NONE DETECTED    Comment:        DRUG SCREEN FOR MEDICAL PURPOSES ONLY.  IF CONFIRMATION IS NEEDED FOR ANY PURPOSE, NOTIFY LAB WITHIN 5 DAYS.        LOWEST DETECTABLE LIMITS FOR URINE DRUG SCREEN Drug Class       Cutoff (ng/mL) Amphetamine      1000 Barbiturate      200 Benzodiazepine   440 Tricyclics       102 Opiates          300 Cocaine          300 THC              50   Comprehensive metabolic panel     Status: Abnormal   Collection Time: 06/17/16 11:48 AM  Result Value Ref Range   Sodium 138 135 - 145 mmol/L   Potassium 3.7 3.5 - 5.1 mmol/L   Chloride 104 101 - 111 mmol/L   CO2 27 22 - 32 mmol/L   Glucose, Bld 97 65 - 99 mg/dL   BUN 18 6 - 20 mg/dL   Creatinine, Ser 1.03 0.61 - 1.24 mg/dL   Calcium 9.6 8.9 - 10.3 mg/dL   Total Protein 8.0 6.5 - 8.1 g/dL   Albumin 5.4 (H) 3.5 - 5.0 g/dL   AST 51 (H) 15 - 41 U/L   ALT 87 (H) 17 - 63 U/L   Alkaline Phosphatase 86 38 - 126 U/L   Total Bilirubin 1.0 0.3 - 1.2 mg/dL   GFR calc non Af Amer >60 >60 mL/min    GFR calc Af Amer >60 >60 mL/min    Comment: (NOTE) The eGFR has been calculated using the CKD EPI equation. This calculation has not been validated in all clinical situations. eGFR's persistently <60 mL/min signify possible Chronic Kidney Disease.    Anion gap 7 5 - 15  Ethanol     Status: None   Collection Time: 06/17/16 11:48 AM  Result Value Ref Range   Alcohol, Ethyl (B) <5 <5 mg/dL    Comment:  LOWEST DETECTABLE LIMIT FOR SERUM ALCOHOL IS 5 mg/dL FOR MEDICAL PURPOSES ONLY   cbc     Status: None   Collection Time: 06/17/16 11:48 AM  Result Value Ref Range   WBC 5.8 4.0 - 10.5 K/uL   RBC 4.53 4.22 - 5.81 MIL/uL   Hemoglobin 13.9 13.0 - 17.0 g/dL   HCT 40.6 39.0 - 52.0 %   MCV 89.6 78.0 - 100.0 fL   MCH 30.7 26.0 - 34.0 pg   MCHC 34.2 30.0 - 36.0 g/dL   RDW 13.5 11.5 - 15.5 %   Platelets 245 150 - 400 K/uL    Current Facility-Administered Medications  Medication Dose Route Frequency Provider Last Rate Last Dose  . acetaminophen (TYLENOL) tablet 650 mg  650 mg Oral Q4H PRN Margarita Mail, PA-C      . alum & mag hydroxide-simeth (MAALOX/MYLANTA) 200-200-20 MG/5ML suspension 30 mL  30 mL Oral PRN Margarita Mail, PA-C      . benztropine (COGENTIN) tablet 0.5 mg  0.5 mg Oral BID Patrecia Pour, NP      . gabapentin (NEURONTIN) capsule 300 mg  300 mg Oral TID Patrecia Pour, NP      . haloperidol (HALDOL) tablet 1 mg  1 mg Oral BID Patrecia Pour, NP      . ibuprofen (ADVIL,MOTRIN) tablet 600 mg  600 mg Oral Q8H PRN Margarita Mail, PA-C      . LORazepam (ATIVAN) tablet 1 mg  1 mg Oral Q6H PRN Patrecia Pour, NP      . nicotine (NICODERM CQ - dosed in mg/24 hours) patch 21 mg  21 mg Transdermal Daily Abigail Harris, PA-C      . ondansetron (ZOFRAN) tablet 4 mg  4 mg Oral Q8H PRN Margarita Mail, PA-C       Current Outpatient Prescriptions  Medication Sig Dispense Refill  . ALPRAZolam (XANAX) 0.5 MG tablet Take 0.5 mg by mouth 3 (three) times daily. As directed.    .  diphenhydrAMINE (BENADRYL) 50 MG capsule Take 100 mg by mouth at bedtime.    Marland Kitchen HYDROcodone-acetaminophen (NORCO) 10-325 MG per tablet Take 1 tablet by mouth every 6 (six) hours as needed for moderate pain (Shoulder paIn.).       Musculoskeletal: Strength & Muscle Tone: within normal limits Gait & Station: normal Patient leans: N/A  Psychiatric Specialty Exam: Physical Exam  Constitutional: He is oriented to person, place, and time. He appears well-developed and well-nourished.  HENT:  Head: Normocephalic.  Neck: Normal range of motion.  Respiratory: Effort normal.  Musculoskeletal: Normal range of motion.  Neurological: He is alert and oriented to person, place, and time.  Psychiatric: His speech is normal. Judgment normal. His mood appears anxious. He is actively hallucinating. Thought content is delusional. Cognition and memory are impaired.    Review of Systems  Psychiatric/Behavioral: Positive for hallucinations. The patient is nervous/anxious.   All other systems reviewed and are negative.   Blood pressure 111/77, pulse 88, temperature 97.5 F (36.4 C), temperature source Oral, resp. rate 20, SpO2 97 %.There is no height or weight on file to calculate BMI.  General Appearance: Casual  Eye Contact:  Good  Speech:  Normal Rate  Volume:  Normal  Mood:  Anxious  Affect:  Blunt  Thought Process:  Coherent and Descriptions of Associations: Intact  Orientation:  Full (Time, Place, and Person)  Thought Content:  Delusions, Hallucinations: Auditory Visual and Paranoid Ideation  Suicidal Thoughts:  No  Homicidal  Thoughts:  No  Memory:  Immediate;   Fair Recent;   Fair Remote;   Fair  Judgement:  Impaired  Insight:  Fair  Psychomotor Activity:  Normal  Concentration:  Concentration: Fair and Attention Span: Fair  Recall:  [place] of Knowledge:  Good  Language:  Good  Akathisia:  No  Handed:  Right  AIMS (if indicated):     Assets:  Housing Intimacy Leisure  Time Physical Health Resilience Social Support  ADL's:  Intact  Cognition:  Impaired,  Mild  Sleep:        Treatment Plan Summary: Daily contact with patient to assess and evaluate symptoms and progress in treatment, Medication management and Plan schizoaffective disorder, bipolar type:  -Crisis stabilization -Medication management:  Started Haldol 1 mg BID for psychosis, Cogentin 0.5 mg BID for EPS, Ativan 1 mg every six hours PRN benzodiazepine withdrawal, and gabapentin 300 mg TID for mood and neuropathic pain -Individual counseling  Disposition: Recommend psychiatric Inpatient admission when medically cleared.  Waylan Boga, NP 06/18/2016 10:36 AM  Patient seen face-to-face for psychiatric evaluation, chart reviewed and case discussed with the physician extender and developed treatment plan. Reviewed the information documented and agree with the treatment plan. Corena Pilgrim, MD

## 2016-06-18 NOTE — [facility] Group Notes (Signed)
Adult Psychoeducational Group Note  Date:  06/18/2016 Time:  8:00 PM  Group Topic/Focus:  Wrap-Up Group:   The focus of this group is to help patients review their daily goal of treatment and discuss progress on daily workbooks.  Participation Level:  Active  Participation Quality:  Attentive  Affect:  Appropriate  Cognitive:  Alert  Insight: Appropriate  Engagement in Group:  Engaged  Modes of Intervention:  Discussion and Education  Additional Comments:  Patient reported working on his "emotions" and feeling like himself throughout the day.  Elmore GuiseSLOAN, Yocelyn Brocious N 06/18/2016, 9:20 PM

## 2016-06-18 NOTE — Progress Notes (Signed)
Charles Dominguez is a 51 year old male being admitted involuntarily to [facility] from [facility].  He was brought in under IVC due to not sleeping and delusional about people sending him signals through his skin and they are out to get him.  He was trying to get on his roof to be able to receive the signals better.  [facility], he denied SI/HI.  He does admit that he hears voices and sees things that aren't there.  He is currently wearing a bandaid on his right hand to keep others from getting into his veins.  He denies any medical issues and appears to be in no physical distress.  He does report that he has a lot of anxiety and was prescribed xanax to help.  He also reported that he takes Norco three times per day for his chronic shoulder pain.  He has no history of psychiatric inpatient or outpatient treatment.  He is diagnosed with Schizophrenia and Bipolar Disorder.  Oriented him tot he unit.  Admission paperwork completed and signed.  Belongings searched and secured in locker # 37.  Skin assessment completed and noted scar on left shoulder.  Q 15 minute checks initiated for safety.  We will monitor the progress towards his goals.

## 2016-06-18 NOTE — ED Notes (Signed)
[facility]  [employer]iff's department called for transport.

## 2016-06-18 NOTE — Tx Team (Signed)
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 skills Financial means General fund of knowledge Motivation for treatment/growth Physical Health Supportive family/friends   PATIENT IDENTIFIED PROBLEMS: Psychosis  Anxiety  "I don't want to hear noises and see things that are not there"  "Learn to deal with things that are not there"  "Be able to differentiate between what is real and what is not"             DISCHARGE CRITERIA:  Improved stabilization in mood, thinking, and/or behavior Verbal commitment to aftercare and medication compliance  PRELIMINARY DISCHARGE PLAN: Outpatient therapy Medication management  PATIENT/FAMILY INVOLVEMENT: This treatment plan has been presented to and reviewed with the patient, Charles Dominguez.  The patient and family have been given the opportunity to ask questions and make suggestions.  Levin BaconHeather V Kennet Mccort, RN 06/18/2016, 5:47 PM

## 2016-06-19 ENCOUNTER — Encounter (HOSPITAL_COMMUNITY): Payer: Self-pay | Admitting: Psychiatry

## 2016-06-19 DIAGNOSIS — F3164 Bipolar disorder, current episode mixed, severe, with psychotic features: Secondary | ICD-10-CM | POA: Diagnosis present

## 2016-06-19 DIAGNOSIS — F1310 Sedative, hypnotic or anxiolytic abuse, uncomplicated: Secondary | ICD-10-CM | POA: Clinically undetermined

## 2016-06-19 DIAGNOSIS — R45851 Suicidal ideations: Secondary | ICD-10-CM

## 2016-06-19 DIAGNOSIS — Z79899 Other long term (current) drug therapy: Secondary | ICD-10-CM

## 2016-06-19 DIAGNOSIS — Z9889 Other specified postprocedural states: Secondary | ICD-10-CM

## 2016-06-19 DIAGNOSIS — F1120 Opioid dependence, uncomplicated: Secondary | ICD-10-CM | POA: Clinically undetermined

## 2016-06-19 DIAGNOSIS — Z818 Family history of other mental and behavioral disorders: Secondary | ICD-10-CM

## 2016-06-19 MED ORDER — DIPHENHYDRAMINE HCL 25 MG PO CAPS
25.0000 mg | ORAL_CAPSULE | Freq: Three times a day (TID) | ORAL | Status: DC | PRN
  Administered 2016-06-23: 25 mg via ORAL
  Filled 2016-06-19: qty 1

## 2016-06-19 MED ORDER — HYDROXYZINE HCL 25 MG PO TABS: 25.0000 mg | ORAL_TABLET | Freq: Four times a day (QID) | ORAL | Status: DC | PRN

## 2016-06-19 MED ORDER — CHLORDIAZEPOXIDE HCL 25 MG PO CAPS: 25.0000 mg | ORAL_CAPSULE | Freq: Three times a day (TID) | ORAL | Status: DC

## 2016-06-19 MED ORDER — CHLORDIAZEPOXIDE HCL 25 MG PO CAPS: 25.0000 mg | ORAL_CAPSULE | Freq: Four times a day (QID) | ORAL | Status: DC | PRN

## 2016-06-19 MED ORDER — IBUPROFEN 600 MG PO TABS: 600.0000 mg | ORAL_TABLET | Freq: Three times a day (TID) | ORAL | Status: DC | PRN

## 2016-06-19 MED ORDER — THIAMINE HCL 100 MG/ML IJ SOLN: 100.0000 mg | Freq: Once | INTRAMUSCULAR | Status: DC

## 2016-06-19 MED ORDER — TRAZODONE HCL 150 MG PO TABS
150.0000 mg | ORAL_TABLET | Freq: Every day | ORAL | Status: DC
  Administered 2016-06-19: 150 mg via ORAL
  Filled 2016-06-19 (×3): qty 1

## 2016-06-19 MED ORDER — LOPERAMIDE HCL 2 MG PO CAPS: 2.0000 mg | ORAL_CAPSULE | ORAL | Status: DC | PRN

## 2016-06-19 MED ORDER — HALOPERIDOL LACTATE 5 MG/ML IJ SOLN: 5.0000 mg | Freq: Three times a day (TID) | INTRAMUSCULAR | Status: DC | PRN

## 2016-06-19 MED ORDER — LORAZEPAM 1 MG PO TABS
1.0000 mg | ORAL_TABLET | Freq: Four times a day (QID) | ORAL | Status: AC
  Administered 2016-06-19 – 2016-06-20 (×3): 1 mg via ORAL
  Filled 2016-06-19 (×3): qty 1

## 2016-06-19 MED ORDER — THIAMINE HCL 100 MG/ML IJ SOLN
100.0000 mg | Freq: Once | INTRAMUSCULAR | Status: DC
Start: 2016-06-19 — End: 2016-06-19

## 2016-06-19 MED ORDER — LITHIUM CARBONATE 300 MG PO CAPS
300.0000 mg | ORAL_CAPSULE | Freq: Two times a day (BID) | ORAL | Status: DC
  Administered 2016-06-19 – 2016-06-23 (×8): 300 mg via ORAL
  Filled 2016-06-19: qty 14
  Filled 2016-06-19 (×10): qty 1
  Filled 2016-06-19: qty 14
  Filled 2016-06-19 (×2): qty 1

## 2016-06-19 MED ORDER — LORAZEPAM 1 MG PO TABS
1.0000 mg | ORAL_TABLET | Freq: Three times a day (TID) | ORAL | Status: AC
  Administered 2016-06-20 – 2016-06-21 (×3): 1 mg via ORAL
  Filled 2016-06-19 (×3): qty 1

## 2016-06-19 MED ORDER — ONDANSETRON 4 MG PO TBDP
4.0000 mg | ORAL_TABLET | Freq: Four times a day (QID) | ORAL | Status: DC | PRN
Start: 2016-06-19 — End: 2016-06-19

## 2016-06-19 MED ORDER — ARIPIPRAZOLE 5 MG PO TABS
5.0000 mg | ORAL_TABLET | ORAL | Status: DC
  Administered 2016-06-19 – 2016-06-20 (×2): 5 mg via ORAL
  Filled 2016-06-19 (×6): qty 1

## 2016-06-19 MED ORDER — LOPERAMIDE HCL 2 MG PO CAPS: 2.0000 mg | ORAL_CAPSULE | ORAL | Status: AC | PRN

## 2016-06-19 MED ORDER — CHLORDIAZEPOXIDE HCL 25 MG PO CAPS: 25.0000 mg | ORAL_CAPSULE | ORAL | Status: DC

## 2016-06-19 MED ORDER — DIPHENHYDRAMINE HCL 50 MG/ML IJ SOLN: 25.0000 mg | Freq: Three times a day (TID) | INTRAMUSCULAR | Status: DC | PRN

## 2016-06-19 MED ORDER — ADULT MULTIVITAMIN W/MINERALS CH
1.0000 | ORAL_TABLET | Freq: Every day | ORAL | Status: DC
  Administered 2016-06-19 – 2016-06-23 (×5): 1 via ORAL
  Filled 2016-06-19 (×9): qty 1

## 2016-06-19 MED ORDER — HALOPERIDOL 5 MG PO TABS
5.0000 mg | ORAL_TABLET | Freq: Three times a day (TID) | ORAL | Status: DC | PRN
  Administered 2016-06-23: 5 mg via ORAL
  Filled 2016-06-19: qty 1

## 2016-06-19 MED ORDER — ONDANSETRON 4 MG PO TBDP: 4.0000 mg | ORAL_TABLET | Freq: Four times a day (QID) | ORAL | Status: AC | PRN

## 2016-06-19 MED ORDER — LORAZEPAM 1 MG PO TABS: 1.0000 mg | ORAL_TABLET | Freq: Four times a day (QID) | ORAL | Status: AC | PRN

## 2016-06-19 MED ORDER — VITAMIN B-1 100 MG PO TABS
100.0000 mg | ORAL_TABLET | Freq: Every day | ORAL | Status: DC
Start: 2016-06-20 — End: 2016-06-19

## 2016-06-19 MED ORDER — ADULT MULTIVITAMIN W/MINERALS CH: 1.0000 | ORAL_TABLET | Freq: Every day | ORAL | Status: DC

## 2016-06-19 MED ORDER — HYDROXYZINE HCL 25 MG PO TABS
25.0000 mg | ORAL_TABLET | Freq: Four times a day (QID) | ORAL | Status: AC | PRN
  Administered 2016-06-22: 25 mg via ORAL
  Filled 2016-06-19: qty 1

## 2016-06-19 MED ORDER — CHLORDIAZEPOXIDE HCL 25 MG PO CAPS: 25.0000 mg | ORAL_CAPSULE | Freq: Every day | ORAL | Status: DC

## 2016-06-19 MED ORDER — VITAMIN B-1 100 MG PO TABS
100.0000 mg | ORAL_TABLET | Freq: Every day | ORAL | Status: DC
  Administered 2016-06-20 – 2016-06-23 (×4): 100 mg via ORAL
  Filled 2016-06-19 (×7): qty 1

## 2016-06-19 MED ORDER — LORAZEPAM 1 MG PO TABS
1.0000 mg | ORAL_TABLET | Freq: Every day | ORAL | Status: AC
  Administered 2016-06-23: 1 mg via ORAL
  Filled 2016-06-19: qty 1

## 2016-06-19 MED ORDER — CHLORDIAZEPOXIDE HCL 25 MG PO CAPS: 25.0000 mg | ORAL_CAPSULE | Freq: Four times a day (QID) | ORAL | Status: DC

## 2016-06-19 MED ORDER — LORAZEPAM 1 MG PO TABS
1.0000 mg | ORAL_TABLET | Freq: Two times a day (BID) | ORAL | Status: AC
  Administered 2016-06-21 – 2016-06-22 (×2): 1 mg via ORAL
  Filled 2016-06-19 (×2): qty 1

## 2016-06-19 NOTE — Progress Notes (Signed)
[name]. Charles Dominguez had been up and visible in milieu this evening, spoke about how he did not sleep well the night before, feeling anxious, seen as restless in milieu. [name] spoke about how he has been having a sensation in his hand that gives him a signal. He spoke about how that sensation is not as strong since being on medications but is still present. Charles Dominguez did receive all bedtime medications without incident. He did not verbalize any complaints of pain but did speak about feeling anxious and restless. A. Support and encouragement provided. R. Safety maintained, will continue to monitor.

## 2016-06-19 NOTE — [facility] Group Notes (Signed)
Adult Psychoeducational Group Note  Date:  06/19/2016 Time:  [id number]  Group Topic/Focus:  Goal setting group with discussion about healthy coping skills. During this group patients were asked to identify one coping skill which was written on the marker board, and were asked to identify the thing that was most important to them today which they would like to accomplish.  After the nurse provided education on holistic self-care, and coached patients through deep-breathing exercises.  Participation Level:  Active  Participation Quality:  Appropriate  Affect:  Excited  Cognitive:  Alert and Appropriate  Insight: Appropriate  Engagement in Group:  Engaged  Modes of Intervention:  Education and Support  Additional Comments:  Attended entire group, contributed 1 coping skill and stated his goal today was to maintain control of his thoughts by reading.  Lindajo RoyalDaniel P Eugene Dominguez 06/19/2016, 2:27 PM

## 2016-06-19 NOTE — [facility] Suicide Risk Assessment (Signed)
[facility] INPATIENT:  Family/Significant Other Suicide Prevention Education  Suicide Prevention Education:    No SI/HI at admission - no need for SPE.  Consent was given to talk with wife Raelene Bottngria Aggarwal [phone], adult son Domicic Dallas SchimkeCopeland, adult daughter Fredirick Lathelice Copeland, or minor daughter Liliane ShiKayla Bour.  Carloyn JaegerMareida J Grossman-Orr 06/19/2016, 2:40 PM

## 2016-06-19 NOTE — Progress Notes (Signed)
Adult Psychoeducational Group Note  Date:  06/19/2016 Time:  8:20 PM  Group Topic/Focus:  Wrap-Up Group:   The focus of this group is to help patients review their daily goal of treatment and discuss progress on daily workbooks.  Participation Level:  Active  Participation Quality:  Appropriate  Affect:  Appropriate  Cognitive:  Appropriate  Insight: Appropriate  Engagement in Group:  Engaged  Modes of Intervention:  Discussion  Additional Comments:The patient expressed that he attended group.The patient also said that he had a great day.  Octavio Mannshigpen, Eisen Robenson Lee 06/19/2016, 8:20 PM

## 2016-06-19 NOTE — [facility] Group Notes (Signed)
[facility] Group Notes: (Clinical Social Work)   06/19/2016      Type of Therapy:  Group Therapy   Participation Level:  Did Not Attend despite MHT prompting   Angeleen Horney Grossman-Orr, LCSW 06/19/2016, 12:14 PM     

## 2016-06-19 NOTE — [facility] Suicide Risk Assessment (Signed)
[facility]   Nursing information obtained from:  Patient Demographic factors:  Male Current Mental Status:  NA Loss Factors:  Decrease in vocational status Historical Factors:  Family history of mental illness or substance abuse Risk Reduction Factors:  Sense of responsibility to family, Living with another person, especially a relative  Total Time spent with patient: 30 minutes Principal Problem: Bipolar disorder, curr episode mixed, severe, with psychotic features (HCC) Diagnosis:   Patient Active Problem List   Diagnosis Date Noted  . Bipolar disorder, curr episode mixed, severe, with psychotic features (HCC) [F31.64] 06/19/2016   Subjective Data: Please see H&P.   Continued Clinical Symptoms:  Alcohol Use Disorder Identification Test Final Score (AUDIT): 0 The "Alcohol Use Disorders Identification Test", Guidelines for Use in Primary Care, Second Edition.  World Science writerHealth Organization [facility](WHO). Score between 0-7:  no or low risk or alcohol related problems. Score between 8-15:  moderate risk of alcohol related problems. Score between 16-19:  high risk of alcohol related problems. Score 20 or above:  warrants further diagnostic evaluation for alcohol dependence and treatment.   CLINICAL FACTORS:   Bipolar Disorder:   Mixed State Unstable or Poor Therapeutic Relationship Previous Psychiatric Diagnoses and Treatments   Musculoskeletal: Strength & Muscle Tone: within normal limits Gait & Station: normal Patient leans: N/A  Psychiatric Specialty Exam: Physical Exam  Review of Systems  Psychiatric/Behavioral: Positive for depression and suicidal ideas. The patient is nervous/anxious and has insomnia.   All other systems reviewed and are negative.   Blood pressure 105/73, pulse 90, temperature 98.2 F (36.8 C), temperature source Oral, resp. rate 16, height 5\' 11"  (1.803 m), weight 79.4 kg (175 lb), SpO2 100 %.Body mass index is 24.41 kg/m.           Please see H&P.                                                 COGNITIVE FEATURES THAT CONTRIBUTE TO RISK:  Closed-mindedness, Polarized thinking and Thought constriction (tunnel vision)    SUICIDE RISK:   Severe:  Frequent, intense, and enduring suicidal ideation, specific plan, no subjective intent, but some objective markers of intent (i.e., choice of lethal method), the method is accessible, some limited preparatory behavior, evidence of impaired self-control, severe dysphoria/symptomatology, multiple risk factors present, and few if any protective factors, particularly a lack of social support.  PLAN OF CARE: Please see H&P.   I certify that inpatient services furnished can reasonably be expected to improve the patient's condition.   Charles Grandberry, MD 06/19/2016, 1:06 PM

## 2016-06-19 NOTE — Progress Notes (Signed)
Nursing Note 06/19/2016 [id number]  Data Reports sleeping poor with PRN sleep med.  Rates depression 2/10, hopelessness 8/10, and anxiety 5/10. Affect anxious.  Denies HI, SI.  Says he hears voices and sees static in front of him.  Attending groups, pleasant and appropriate.  Reported left shoulder pain (ongoing).  Mild opiate withdrawal sx.  Reported to nurse in afternoon he thought he had an addiction "I take the Norco for pain but sometimes I think I take it to get rid of the guilt for how I feel."  States he took 3-4 Norco 10/325's daily as well as 3x 1mg  Xanax daily.  Disclosing ongoing delusions of people trying to contact him from another country, focused on covering a mole on his hand with a bandage because he feels like bad things can get in.  States he took off the bandage and pressed his hand against the window to get a signal.  Action Spoke with patient 1:1, nurse offered support to patient throughout shift.  Given ibuprofen for pain this AM. PRN clonidine for opiate withdrawal.  Nurse listened to patient as he was discussing potentially being addicted to [employer] and Xanax- provided support, notified MD.  Educated on plan to remain off of Norco and Xanax, monitor for withdrawal symptoms, and treat with medications.  Received Orders received for ibuprofen to be 600mg  TID and CIWA ativan protocol. Continues to be monitored on 15 minute checks for safety.  Response Patient states ibuprofen completely took away his left shoulder pain.  Reports his detox symptoms are minimal"I usually start getting really sick if I don't take my Norco."  Reports feeling great and very happy, very appreciative of the care he is receiving today.  Minimal.  Remains safe and appropriate on unit.
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
## 2016-06-19 NOTE — H&P (Signed)
Psychiatric Admission Assessment Adult  Patient Identification: Charles Dominguez MRN:  [id number] Date of Evaluation:  06/19/2016 Chief Complaint:Patient states " I took some pills , I was really anxious .'   Principal Diagnosis: Bipolar disorder, curr episode mixed, severe, with psychotic features (HCC) Diagnosis:   Patient Active Problem List   Diagnosis Date Noted  . Bipolar disorder, curr episode mixed, severe, with psychotic features [facility](HCC) [F31.64] 06/19/2016   History of Present Illness: Charles Dominguez is a 51 y.o. AA male , who is married , unemployed , [place] , who has a hx of bipolar mood sx as well as psychosis , [facility] with worsening sx.  Patient seen and chart reviewed.Discussed patient with treatment team. Pt today reports worsening mood sx since the past several week. Pt reports that he goes through phases of "annoying period" as well as phases of "depression". Pt reports that he has been feeling sad, anxious , racing thoughts , and has been having SI. Pt reports that he took some unisom , xanax and Norco to feel better , but then his wife called 911. Pt reports that he has been having these thoughts that there is this mole on his hand that has been transmitting communications to the back of his ears and these communications are coming from [place]. Pt also reports there is a drone flying right over his hand . Pt reports sleep issues since the past several days. Pt reports he has been to ED in the past several times , but denies IP admissions.Pt also denies past OP follow up.Pt reports suicide attempt 5 months ago when he OD sed on unisom pills , but he did not go to a hospital, just felt drowsy for a while.  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 Pt reports he is prescribed Norco for the same.Pt also reports he has been prescribed xanax for anxiety sx- which he has  been taking .     Associated Signs/Symptoms: Depression Symptoms:  depressed mood, anhedonia, insomnia, psychomotor retardation, fatigue, feelings of worthlessness/guilt, difficulty concentrating, hopelessness, recurrent thoughts of death, suicidal thoughts with specific plan, suicidal attempt, anxiety, (Hypo) Manic Symptoms:  Delusions, Distractibility, Elevated Mood, Impulsivity, Irritable Mood, Labiality of Mood, Anxiety Symptoms:  Panic Symptoms, Psychotic Symptoms:  Delusions, Paranoia, PTSD Symptoms: Had a traumatic exposure:  denies Total Time spent with patient: 45 minutes  Past Psychiatric History: Please see H&P ABOVE  Is the patient at risk to self? Yes.    Has the patient been a risk to self in the past 6 months? Yes.    Has the patient been a risk to self within the distant past? No.  Is the patient a risk to others? No.  Has the patient been a risk to others in the past 6 months? No.  Has the patient been a risk to others within the distant past? No.   Prior Inpatient Therapy:   Prior Outpatient Therapy:    Alcohol Screening: 1. How often do you have a drink containing alcohol?: Never 9. Have you or someone else been injured as a result of your drinking?: No 10. Has a relative or friend or a doctor or another health worker been concerned about your drinking or suggested you cut down?: No Alcohol Use Disorder Identification Test Final Score (AUDIT): 0 Brief Intervention: AUDIT score less than 7 or less-screening does  not suggest unhealthy drinking-brief intervention not indicated Substance Abuse History in the last 12 months:  No. Consequences of Substance Abuse: Negative Previous Psychotropic Medications: Yes Prudy Feeler Psychological Evaluations: No  Past Medical History:  Past Medical History:  Diagnosis Date  . Back muscle spasm   . Shoulder pain     Past Surgical History:  Procedure Laterality Date  . RETINAL TEAR REPAIR CRYOTHERAPY     both  eyes has surgery  . SHOULDER SURGERY     Family History:  Family History  Problem Relation Age of Onset  . Bipolar disorder Mother   . Bipolar disorder Maternal Aunt    Family Psychiatric  History: pls see above Tobacco Screening: Have you used any form of tobacco in the last 30 days? (Cigarettes, Smokeless Tobacco, Cigars, and/or Pipes): No Social History:  History  Alcohol Use No     History  Drug Use No    Additional Social History:      Pain Medications: Norco tid for shoulder pain Prescriptions: Norco and Xanax Over the Counter: None History of alcohol / drug use?: No history of alcohol / drug abuse                    Allergies:  No Known Allergies Lab Results: No results found for this or any previous visit (from the past 48 hour(s)).  Blood Alcohol level:  Lab Results  Component Value Date   ETH <5 06/17/2016    Metabolic Disorder Labs:  No results found for: HGBA1C, MPG No results found for: PROLACTIN No results found for: CHOL, TRIG, HDL, CHOLHDL, VLDL, LDLCALC  Current Medications: Current Facility-Administered Medications  Medication Dose Route Frequency Provider Last Rate Last Dose  . acetaminophen (TYLENOL) tablet 650 mg  650 mg Oral Q4H PRN Charm Rings, NP      . alum & mag hydroxide-simeth (MAALOX/MYLANTA) 200-200-20 MG/5ML suspension 30 mL  30 mL Oral PRN Charm Rings, NP      . ARIPiprazole (ABILIFY) tablet 5 mg  5 mg Oral BH-qamhs Dahlila Pfahler, MD      . benztropine (COGENTIN) tablet 0.5 mg  0.5 mg Oral BID Charm Rings, NP   0.5 mg at 06/19/16 0840  . cloNIDine (CATAPRES) tablet 0.1 mg  0.1 mg Oral Q8H PRN Beau Fanny, FNP   0.1 mg at 06/19/16 0844  . dicyclomine (BENTYL) tablet 20 mg  20 mg Oral Q6H PRN Beau Fanny, FNP      . diphenhydrAMINE (BENADRYL) capsule 25 mg  25 mg Oral Q8H PRN Jomarie Longs, MD       Or  . diphenhydrAMINE (BENADRYL) injection 25 mg  25 mg Intramuscular Q8H PRN Ronita Hargreaves, MD      .  gabapentin (NEURONTIN) capsule 400 mg  400 mg Oral TID Adonis Brook, NP   400 mg at 06/19/16 0840  . haloperidol (HALDOL) tablet 5 mg  5 mg Oral Q8H PRN Jomarie Longs, MD       Or  . haloperidol lactate (HALDOL) injection 5 mg  5 mg Intramuscular Q8H PRN Dyllan Hughett, MD      . hydrOXYzine (ATARAX/VISTARIL) tablet 25 mg  25 mg Oral Q6H PRN Beau Fanny, FNP   25 mg at 06/18/16 2105  . ibuprofen (ADVIL,MOTRIN) tablet 800 mg  800 mg Oral Q8H PRN Adonis Brook, NP   800 mg at 06/19/16 0844  . lidocaine (LIDODERM) 5 % 1 patch  1 patch Transdermal Q24H Adonis Brook,  NP   1 patch at 06/18/16 1710  . lithium carbonate capsule 300 mg  300 mg Oral BID WC Tyrea Froberg, MD      . loperamide (IMODIUM) capsule 2-4 mg  2-4 mg Oral PRN Beau Fanny, FNP      . LORazepam (ATIVAN) tablet 1 mg  1 mg Oral Q6H PRN Charm Rings, NP      . magnesium hydroxide (MILK OF MAGNESIA) suspension 30 mL  30 mL Oral Daily PRN Charm Rings, NP      . methocarbamol (ROBAXIN) tablet 500 mg  500 mg Oral Q8H PRN Beau Fanny, FNP      . ondansetron (ZOFRAN) tablet 4 mg  4 mg Oral Q8H PRN Charm Rings, NP      . traZODone (DESYREL) tablet 150 mg  150 mg Oral QHS Jomarie Longs, MD       PTA Medications: Prescriptions Prior to Admission  Medication Sig Dispense Refill Last Dose  . diphenhydrAMINE (BENADRYL) 50 MG capsule Take 100 mg by mouth at bedtime.   06/16/2016 at Unknown time  . HYDROcodone-acetaminophen (NORCO) 10-325 MG tablet Take 1 tablet by mouth four times a day as needed  0     Musculoskeletal: Strength & Muscle Tone: within normal limits Gait & Station: normal Patient leans: N/A  Psychiatric Specialty Exam: Physical Exam  Review of Systems  Psychiatric/Behavioral: Positive for depression and suicidal ideas. The patient is nervous/anxious and has insomnia.   All other systems reviewed and are negative.   Blood pressure 105/73, pulse 90, temperature 98.2 F (36.8 C), temperature source  Oral, resp. rate 16, height 5\' 11"  (1.803 m), weight 79.4 kg (175 lb), SpO2 100 %.Body mass index is 24.41 kg/m.  General Appearance: Disheveled  Eye Contact:  Good  Speech:  Normal Rate  Volume:  Normal  Mood:  Anxious, Euphoric and Irritable  Affect:  Labile  Thought Process:  Irrelevant and Descriptions of Associations: Circumstantial  Orientation:  Full (Time, Place, and Person)  Thought Content:  Delusions, Paranoid Ideation and Rumination  Suicidal Thoughts:  Yes.  with intent/plan  Homicidal Thoughts:  No  Memory:  Immediate;   Fair Recent;   Fair Remote;   Fair  Judgement:  Impaired  Insight:  Shallow  Psychomotor Activity:  Normal  Concentration:  Concentration: Fair and Attention Span: Fair  Recall:  [employer] of Knowledge:  Fair  Language:  Fair  Akathisia:  No  Handed:  Right  AIMS (if indicated):     Assets:  Communication Skills Desire for Improvement  ADL's:  Intact  Cognition:  WNL  Sleep:  Number of Hours: 6    Treatment Plan Summary: Daily contact with patient to assess and evaluate symptoms and progress in treatment and Medication management  Observation Level/Precautions:  15 minute checks    Psychotherapy:  Individual and group therapy   Medications:  Will start Abilify 5 mg po bid for Bipolar do. Will start Lithium 300 mg po bid for mood lability. Li level in 5 days. Will continue Gabapentin 400 mg po tid for pain, anxiety sx. Will add Trazodone 150 mg po qhs for sleep issues. COWS protocol /clonidine PRN for opioid withdrawal sx. CIWA/librium protocol- patient was on hydrocodone/xanax - prescribed - verified [place] controlled substance database  Labs - reviewed UDS- pos for bzd, opioids  Cbc - wnl, cmp - wnl  ekg reviewed - qtc wnl. Will get tsh, lipid panel, hba1c, pl .  Consultations:  CSW  Discharge Concerns:  Stability and safety       Physician Treatment Plan for Primary Diagnosis: Bipolar disorder, curr episode mixed, severe, with  psychotic features (HCC) Long Term Goal(s): Improvement in symptoms so as ready for discharge  Short Term Goals: Ability to verbalize feelings will improve and Compliance with prescribed medications will improve  Physician Treatment Plan for Secondary Diagnosis: Principal Problem:   Bipolar disorder, curr episode mixed, severe, with psychotic features (HCC)  Long Term Goal(s): Improvement in symptoms so as ready for discharge  Short Term Goals: Ability to verbalize feelings will improve and Compliance with prescribed medications will improve  I certify that inpatient services furnished can reasonably be expected to improve the patient's condition.    Jomarie Longs, MD 1/27/20181:07 PM

## 2016-06-20 LAB — LIPID PANEL
CHOL/HDL RATIO: 5.1 ratio
Cholesterol: 229 mg/dL — ABNORMAL HIGH (ref 0–200)
HDL: 45 mg/dL (ref >40)
LDL CALC: 151 mg/dL — AB (ref 0–99)
Triglycerides: 163 mg/dL — ABNORMAL HIGH (ref <150)
VLDL: 33 mg/dL (ref 0–40)

## 2016-06-20 LAB — TSH: TSH: 0.973 u[IU]/mL (ref 0.350–4.500)

## 2016-06-20 MED ORDER — TRAZODONE HCL 100 MG PO TABS
200.0000 mg | ORAL_TABLET | Freq: Every day | ORAL | Status: DC
  Administered 2016-06-20 – 2016-06-21 (×2): 200 mg via ORAL
  Filled 2016-06-20: qty 2
  Filled 2016-06-20: qty 14
  Filled 2016-06-20 (×4): qty 2

## 2016-06-20 MED ORDER — ARIPIPRAZOLE 10 MG PO TABS
10.0000 mg | ORAL_TABLET | ORAL | Status: DC
  Administered 2016-06-20 – 2016-06-23 (×5): 10 mg via ORAL
  Filled 2016-06-20 (×10): qty 1

## 2016-06-20 NOTE — Progress Notes (Signed)
Adult Psychoeducational Group Note  Date:  06/20/2016 Time:  2:25 PM  Group Topic/Focus:  Relationships, healthy qualities  Participation Level:  Active  Participation Quality:  Appropriate, Attentive, Monopolizing, Sharing and Supportive  Affect:  Blunted and Depressed  Cognitive:  Alert, Appropriate, Oriented and  pt. had periods of brief perseveration on books he was recommending to the group.   Insight: Improving  Engagement in Group:  Developing/Improving  Modes of Intervention:  Discussion, Education, Exploration and Support  Additional Comments:  Pt. Shared that his wife provides trust, strength and balance for pt. And his children. Pt. Shared about a time when "everyone" in the family was sick, but his wife cared for the entire family.  Pt. Appeared eager to offer peers resources (book titles) that he found helpful in his recovery.    Delila PereyraMichels, Ellinor Test Louise 06/20/2016, 2:25 PM

## 2016-06-20 NOTE — [facility] Group Notes (Signed)
[facility] Group Notes:  (Clinical Social Work)  06/20/2016  11:00AM-12:00PM  Summary of Progress/Problems:  The main focus of today's process group was to listen to a variety of genres of music and to identify that different types of music provoke different responses.  The patient then was able to identify personally what was soothing for them, as well as energizing, as well as how patient can personally use this knowledge in sleep habits, with depression, and with other symptoms.  The patient expressed at the beginning of group the overall feeling of "tired" and he contributed often throughout group, was very engaged.  Type of Therapy:  Music Therapy   Participation Level:  Active  Participation Quality:  Attentive and Sharing  Affect:  Blunted  Cognitive:  Oriented  Insight:  Engaged  Engagement in Therapy:  Engaged  Modes of Intervention:   Activity, Exploration  Charles MantleMareida Grossman-Orr, LCSW 06/20/2016

## 2016-06-20 NOTE — Progress Notes (Signed)
Charles Dominguez. Charles Dominguez had been up and visible in milieu this evening, attended and participated in evening group activity. Charles Dominguez still endorsed feeling various sensations coming from his hand, also endorsed some anxiety and spoke about how he did not sleep well last night. Charles Dominguez did receive bedtime medications without incident and did not verbalize any complaints of pain. A. Support and encouragement provided. R. Safety maintained, will continue to monitor.

## 2016-06-20 NOTE — Progress Notes (Addendum)
D)Reports poor sleep (3-4 hours). Pt. C/o anxiety and some "beginning tingling" in legs which pt. Attributes to beginning withdrawal symptoms.  Pt. Reports that he continues to receive "signals" and "sensations" through a spot on the back of his hand.  Pt. Reports his anxiety is maintained better if he has the spot on the back of his covered with a bandaid.  Pt. Requested a new bandaid for the back of his hand. Rated depression 4/10 and hopelessness 5/10. Pt. Focused on importance of forgiveness and spoke about the inability to move on until he has forgiven those who have wronged him.  A) Pt. Offered support and medication education offered regarding Neurontin and Ativan and addressing his symptoms of tingling and anxiety respectively.  Pt. Stated "ok, I can feel it [anxiety]  ramping up, but I'll try to wait for the medicine to kick in". Pt. Offered new bandaid and this RN watched while pt. Attempted to replace current bandaid.  Small mole noted on back of had, no evidence of irritation or issues with back of hand. Assistance offered.  R) Pt. Interacting with student RN  And appears more calm when distracted by discussing topics of interest to him. Continues on q 15 min. Observations and is safe at this time.

## 2016-06-20 NOTE — Progress Notes (Signed)
[facility] MD Progress Note  06/20/2016 2:43 PM Charles Dominguez  MRN:  [id number] Subjective:  Patient states " I still feel there is communication going from my birthmark on my hand to my ear. Last night I could not sleep since I had this urge to go up to the roof and get good signal. My wife called 911 since I was trying to get on the roof of my house ."  Objective:Patient seen and chart reviewed.Discussed patient with treatment team.  Patient today seen as delusional , preoccupied with thoughts that he has to help someone in [place] who is trying to signal him through his birthmark. Pt also has sleep issues , anxiety sx. Per RN - pt continues to need lots of support, tolerating medications well, denies ADRs.     Principal Problem: Bipolar disorder, curr episode mixed, severe, with psychotic features (HCC) Diagnosis:   Patient Active Problem List   Diagnosis Date Noted  . Bipolar disorder, curr episode mixed, severe, with psychotic features (HCC) [F31.64] 06/19/2016  . Opioid dependence in controlled environment (HCC) [F11.20] 06/19/2016  . 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   Past Medical History:  Past Medical History:  Diagnosis Date  . Back muscle spasm   . Shoulder pain     Past Surgical History:  Procedure Laterality Date  . RETINAL TEAR REPAIR CRYOTHERAPY     both eyes has surgery  . SHOULDER SURGERY     Family History:  Family History  Problem Relation Age of Onset  . Bipolar disorder Mother   . Bipolar disorder Maternal Aunt    Family Psychiatric  History: Please see H&P.  Social History: Please see H&P.  History  Alcohol Use No     History  Drug Use No    Social History   Social History  . Marital status: Married    Spouse name: N/A  . Number of children: N/A  . Years of education: N/A   Social History Main Topics  . Smoking status: Never  Smoker  . Smokeless tobacco: Never Used  . Alcohol use No  . Drug use: No  . Sexual activity: Yes    Birth control/ protection: None   Other Topics Concern  . None   Social History Narrative  . None   Additional Social History:    Pain Medications: Norco tid for shoulder pain Prescriptions: Norco and Xanax Over the Counter: None History of alcohol / drug use?: No history of alcohol / drug abuse                    Sleep: Poor  Appetite:  Fair  Current Medications: Current Facility-Administered Medications  Medication Dose Route Frequency Provider Last Rate Last Dose  . alum & mag hydroxide-simeth (MAALOX/MYLANTA) 200-200-20 MG/5ML suspension 30 mL  30 mL Oral PRN Charm Rings, NP      . ARIPiprazole (ABILIFY) tablet 10 mg  10 mg Oral BH-qamhs Shandon Matson, MD      . benztropine (COGENTIN) tablet 0.5 mg  0.5 mg Oral BID Charm Rings, NP   0.5 mg at 06/20/16 0810  . cloNIDine (CATAPRES) tablet 0.1 mg  0.1 mg Oral Q8H PRN Beau Fanny, FNP   0.1 mg at 06/19/16 0844  . dicyclomine (BENTYL) tablet 20 mg  20 mg Oral Q6H PRN Beau Fanny, FNP      . diphenhydrAMINE (  BENADRYL) capsule 25 mg  25 mg Oral Q8H PRN Jomarie LongsSaramma Pheonix Wisby, MD       Or  . diphenhydrAMINE (BENADRYL) injection 25 mg  25 mg Intramuscular Q8H PRN Jomarie LongsSaramma Cohen Boettner, MD      . gabapentin (NEURONTIN) capsule 400 mg  400 mg Oral TID Adonis BrookSheila Agustin, NP   400 mg at 06/20/16 1256  . haloperidol (HALDOL) tablet 5 mg  5 mg Oral Q8H PRN Jomarie LongsSaramma Dyquan Minks, MD       Or  . haloperidol lactate (HALDOL) injection 5 mg  5 mg Intramuscular Q8H PRN Jomarie LongsSaramma Amritha Yorke, MD      . hydrOXYzine (ATARAX/VISTARIL) tablet 25 mg  25 mg Oral Q6H PRN Cobie Leidner, MD      . ibuprofen (ADVIL,MOTRIN) tablet 600 mg  600 mg Oral TID PRN Jomarie LongsSaramma Harris Penton, MD      . lidocaine (LIDODERM) 5 % 1 patch  1 patch Transdermal Q24H Adonis BrookSheila Agustin, NP   1 patch at 06/19/16 1714  . lithium carbonate capsule 300 mg  300 mg Oral BID WC Jomarie LongsSaramma Aissatou Fronczak, MD    300 mg at 06/20/16 0810  . loperamide (IMODIUM) capsule 2-4 mg  2-4 mg Oral PRN Jomarie LongsSaramma Eryka Dolinger, MD      . LORazepam (ATIVAN) tablet 1 mg  1 mg Oral Q6H PRN Corah Willeford, MD      . LORazepam (ATIVAN) tablet 1 mg  1 mg Oral TID Jomarie LongsSaramma Jaedynn Bohlken, MD   1 mg at 06/20/16 1258   Followed by  . [START ON 06/21/2016] LORazepam (ATIVAN) tablet 1 mg  1 mg Oral BID Jomarie LongsSaramma Raaga Maeder, MD       Followed by  . [START ON 06/23/2016] LORazepam (ATIVAN) tablet 1 mg  1 mg Oral Daily Kylei Purington, MD      . magnesium hydroxide (MILK OF MAGNESIA) suspension 30 mL  30 mL Oral Daily PRN Charm RingsJamison Y Lord, NP      . methocarbamol (ROBAXIN) tablet 500 mg  500 mg Oral Q8H PRN Beau FannyJohn C Withrow, FNP      . multivitamin with minerals tablet 1 tablet  1 tablet Oral Daily Jomarie LongsSaramma Quaneshia Wareing, MD   1 tablet at 06/20/16 0811  . ondansetron (ZOFRAN-ODT) disintegrating tablet 4 mg  4 mg Oral Q6H PRN Jomarie LongsSaramma Erza Mothershead, MD      . thiamine (VITAMIN B-1) tablet 100 mg  100 mg Oral Daily Jomarie LongsSaramma Carlis Blanchard, MD   100 mg at 06/20/16 0811  . traZODone (DESYREL) tablet 200 mg  200 mg Oral QHS Jomarie LongsSaramma Cathrine Krizan, MD        Lab Results:  Results for orders placed or performed during the hospital encounter of 06/18/16 (from the past 48 hour(s))  TSH     Status: None   Collection Time: 06/20/16  6:24 AM  Result Value Ref Range   TSH 0.973 0.350 - 4.500 uIU/mL    Comment: Performed by a 3rd Generation assay with a functional sensitivity of <=0.01 uIU/mL. Performed at [facility], [address] [place], [place] [address]   Lipid panel     Status: Abnormal   Collection Time: 06/20/16  6:24 AM  Result Value Ref Range   Cholesterol 229 (H) 0 - 200 mg/dL   Triglycerides 604163 (H) <150 mg/dL   HDL 45 >54>40 mg/dL   Total CHOL/HDL Ratio 5.1 RATIO   VLDL 33 0 - 40 mg/dL   LDL Cholesterol 098151 (H) 0 - 99 mg/dL    Comment:  Total Cholesterol/HDL:CHD Risk Coronary Heart Disease Risk Table                     Men   Women  1/2 Average Risk    3.4   3.3  Average Risk       5.0   4.4  2 X Average Risk   9.6   7.1  3 X Average Risk  23.4   11.0        Use the calculated Patient Ratio above and the CHD Risk Table to determine the patient's CHD Risk.        ATP III CLASSIFICATION (LDL):  <100     mg/dL   Optimal  161-096  mg/dL   Near or Above                    Optimal  130-159  mg/dL   Borderline  045-409  mg/dL   High  >811     mg/dL   Very High Performed at [facility], [address] [place], [place] [address]     Blood Alcohol level:  Lab Results  Component Value Date   ETH <5 06/17/2016    Metabolic Disorder Labs: No results found for: HGBA1C, MPG No results found for: PROLACTIN Lab Results  Component Value Date   CHOL 229 (H) 06/20/2016   TRIG 163 (H) 06/20/2016   HDL 45 06/20/2016   CHOLHDL 5.1 06/20/2016   VLDL 33 06/20/2016   LDLCALC 151 (H) 06/20/2016    Physical Findings: AIMS: Facial and Oral Movements Muscles of Facial Expression: None, normal Lips and Perioral Area: None, normal Jaw: None, normal Tongue: None, normal,Extremity Movements Upper (arms, wrists, hands, fingers): None, normal Lower (legs, knees, ankles, toes): None, normal, Trunk Movements Neck, shoulders, hips: None, normal, Overall Severity Severity of abnormal movements (highest score from questions above): None, normal Incapacitation due to abnormal movements: None, normal Patient's awareness of abnormal movements (rate only patient's report): No Awareness, Dental Status Current problems with teeth and/or dentures?: No Does patient usually wear dentures?: No  CIWA:  CIWA-Ar Total: 6 COWS:  COWS Total Score: 2  Musculoskeletal: Strength & Muscle Tone: within normal limits Gait & Station: normal Patient leans: N/A  Psychiatric Specialty Exam: Physical Exam  Nursing note and vitals reviewed.   Review of Systems  Psychiatric/Behavioral: Positive for depression. The patient is nervous/anxious and has insomnia.    All other systems reviewed and are negative.   Blood pressure 124/84, pulse (!) 120, temperature 98.3 F (36.8 C), resp. rate 16, height 5\' 11"  (1.803 m), weight 79.4 kg (175 lb), SpO2 100 %.Body mass index is 24.41 kg/m.  General Appearance: Guarded  Eye Contact:  Minimal  Speech:  Normal Rate  Volume:  Normal  Mood:  Anxious and Dysphoric  Affect:  Congruent  Thought Process:  Irrelevant and Descriptions of Associations: Circumstantial  Orientation:  Full (Time, Place, and Person)  Thought Content:  Delusions, Ideas of Reference:   Delusions and Rumination  Suicidal Thoughts:  No  Homicidal Thoughts:  No  Memory:  Immediate;   Fair Recent;   Fair Remote;   Fair  Judgement:  Impaired  Insight:  Shallow  Psychomotor Activity:  Increased  Concentration:  Concentration: Poor and Attention Span: Poor  Recall:  [employer] of Knowledge:  Fair  Language:  Fair  Akathisia:  No  Handed:  Right  AIMS (if indicated):     Assets:  Desire  for Improvement  ADL's:  Intact  Cognition:  WNL  Sleep:  Number of Hours: 3.5     Treatment Plan Summary:Patient today continues to be delusional , has sleep issues , continue to treat.  Bipolar disorder, curr episode mixed, severe, with psychotic features (HCC) unstable  Will continue today 06/20/16 plan as below except where it is noted.   Daily contact with patient to assess and evaluate symptoms and progress in treatment and Medication management   For psychosis: Increase Abilify to 10 mg po bid.  For mood sx: Lithium 300 mg po bid.  For anxiety sx: Neurontin 400 mg po tid.  For sleep problems: Increase Trazodone to 200 mg po qhs.  For substance abuse do: COWS protocol for opioid abuse. CIWA ativan protocol for BZD abuse.  Labs reviewed -lipid panel - abnormal - recommend diet management.  CSW will work on disposition.  Mayelin Panos, MD 06/20/2016, 2:43 PM

## 2016-06-21 DIAGNOSIS — F1120 Opioid dependence, uncomplicated: Secondary | ICD-10-CM

## 2016-06-21 DIAGNOSIS — F1310 Sedative, hypnotic or anxiolytic abuse, uncomplicated: Secondary | ICD-10-CM

## 2016-06-21 LAB — HEMOGLOBIN A1C
Hgb A1c MFr Bld: 5.3 % (ref 4.8–5.6)
Mean Plasma Glucose: 105 mg/dL

## 2016-06-21 LAB — PROLACTIN: PROLACTIN: 9.6 ng/mL (ref 4.0–15.2)

## 2016-06-21 NOTE — Progress Notes (Signed)
Adult Psychoeducational Group Note  Date:  06/21/2016 Time:  8:34 PM  Group Topic/Focus:  Wrap-Up Group:   The focus of this group is to help patients review their daily goal of treatment and discuss progress on daily workbooks.  Participation Level:  Active  Participation Quality:  Appropriate  Affect:  Appropriate  Cognitive:  Alert  Insight: Appropriate  Engagement in Group:  Engaged  Modes of Intervention:  Discussion  Additional Comments:  Patient states, "I had a good day". Patient also states that his meds are working well. Patient's goal for today was to stay positive.    Mylisa Brunson L Gotham Raden 06/21/2016, 8:34 PM

## 2016-06-21 NOTE — Progress Notes (Signed)
[facility] MD Progress Note  06/21/2016 1:56 PM Charles Dominguez  MRN:  [id number]   Subjective:  Patient states " I am feeling okay, I just have to figure out how to get to [place]."  Objective:Charles Dominguez is awake and alert. Seen resting in the dayroom found attending group session.  Denies suicidal or homicidal ideation. Denies auditory or visual hallucination and does not appear to be responding to internal stimuli. Patient  Is tangential and delusional  with thoughts, however is redirectable. Patient reports he has two children 49 y.o and 68 y.o that has disowned him because he has pain attacks. patient reports sadness and some depression because of that.  Patient reports he is medication compliant without mediation side effects. Reports a good appetite and reports resting well.Support, encouragement and reassurance was provided.      Principal Problem: Bipolar disorder, curr episode mixed, severe, with psychotic features (HCC) Diagnosis:   Patient Active Problem List   Diagnosis Date Noted  . Bipolar disorder, curr episode mixed, severe, with psychotic features (HCC) [F31.64] 06/19/2016  . Opioid dependence in controlled environment (HCC) [F11.20] 06/19/2016  . 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   Past Medical History:  Past Medical History:  Diagnosis Date  . Back muscle spasm   . Shoulder pain     Past Surgical History:  Procedure Laterality Date  . RETINAL TEAR REPAIR CRYOTHERAPY     both eyes has surgery  . SHOULDER SURGERY     Family History:  Family History  Problem Relation Age of Onset  . Bipolar disorder Mother   . Bipolar disorder Maternal Aunt    Family Psychiatric  History: Please see H&P.  Social History: Please see H&P.  History  Alcohol Use No     History  Drug Use No    Social History   Social History  . Marital  status: Married    Spouse name: N/A  . Number of children: N/A  . Years of education: N/A   Social History Main Topics  . Smoking status: Never Smoker  . Smokeless tobacco: Never Used  . Alcohol use No  . Drug use: No  . Sexual activity: Yes    Birth control/ protection: None   Other Topics Concern  . None   Social History Narrative  . None   Additional Social History:    Pain Medications: Norco tid for shoulder pain Prescriptions: Norco and Xanax Over the Counter: None History of alcohol / drug use?: No history of alcohol / drug abuse                    Sleep: Poor  Appetite:  Fair  Current Medications: Current Facility-Administered Medications  Medication Dose Route Frequency Provider Last Rate Last Dose  . alum & mag hydroxide-simeth (MAALOX/MYLANTA) 200-200-20 MG/5ML suspension 30 mL  30 mL Oral PRN Charm Rings, NP      . ARIPiprazole (ABILIFY) tablet 10 mg  10 mg Oral BH-qamhs Jomarie Longs, MD   10 mg at 06/21/16 0737  . benztropine (COGENTIN) tablet 0.5 mg  0.5 mg Oral BID Charm Rings, NP   0.5 mg at 06/21/16 0737  . cloNIDine (CATAPRES) tablet 0.1 mg  0.1 mg Oral Q8H PRN Beau Fanny, FNP   0.1 mg at 06/19/16 0844  . dicyclomine (BENTYL) tablet 20 mg  20 mg Oral Q6H  PRN Beau FannyJohn C Withrow, FNP      . diphenhydrAMINE (BENADRYL) capsule 25 mg  25 mg Oral Q8H PRN Jomarie LongsSaramma Eappen, MD       Or  . diphenhydrAMINE (BENADRYL) injection 25 mg  25 mg Intramuscular Q8H PRN Jomarie LongsSaramma Eappen, MD      . gabapentin (NEURONTIN) capsule 400 mg  400 mg Oral TID Adonis BrookSheila Agustin, NP   400 mg at 06/21/16 1218  . haloperidol (HALDOL) tablet 5 mg  5 mg Oral Q8H PRN Jomarie LongsSaramma Eappen, MD       Or  . haloperidol lactate (HALDOL) injection 5 mg  5 mg Intramuscular Q8H PRN Jomarie LongsSaramma Eappen, MD      . hydrOXYzine (ATARAX/VISTARIL) tablet 25 mg  25 mg Oral Q6H PRN Saramma Eappen, MD      . ibuprofen (ADVIL,MOTRIN) tablet 600 mg  600 mg Oral TID PRN Jomarie LongsSaramma Eappen, MD      . lidocaine  (LIDODERM) 5 % 1 patch  1 patch Transdermal Q24H Adonis BrookSheila Agustin, NP   1 patch at 06/20/16 1734  . lithium carbonate capsule 300 mg  300 mg Oral BID WC Jomarie LongsSaramma Eappen, MD   300 mg at 06/21/16 0737  . loperamide (IMODIUM) capsule 2-4 mg  2-4 mg Oral PRN Saramma Eappen, MD      . LORazepam (ATIVAN) tablet 1 mg  1 mg Oral Q6H PRN Saramma Eappen, MD      . LORazepam (ATIVAN) tablet 1 mg  1 mg Oral BID Jomarie LongsSaramma Eappen, MD       Followed by  . [START ON 06/23/2016] LORazepam (ATIVAN) tablet 1 mg  1 mg Oral Daily Saramma Eappen, MD      . magnesium hydroxide (MILK OF MAGNESIA) suspension 30 mL  30 mL Oral Daily PRN Charm RingsJamison Y Lord, NP      . methocarbamol (ROBAXIN) tablet 500 mg  500 mg Oral Q8H PRN Beau FannyJohn C Withrow, FNP      . multivitamin with minerals tablet 1 tablet  1 tablet Oral Daily Jomarie LongsSaramma Eappen, MD   1 tablet at 06/21/16 0737  . ondansetron (ZOFRAN-ODT) disintegrating tablet 4 mg  4 mg Oral Q6H PRN Jomarie LongsSaramma Eappen, MD      . thiamine (VITAMIN B-1) tablet 100 mg  100 mg Oral Daily Jomarie LongsSaramma Eappen, MD   100 mg at 06/21/16 0737  . traZODone (DESYREL) tablet 200 mg  200 mg Oral QHS Jomarie LongsSaramma Eappen, MD   200 mg at 06/20/16 2106    Lab Results:  Results for orders placed or performed during the hospital encounter of 06/18/16 (from the past 48 hour(s))  TSH     Status: None   Collection Time: 06/20/16  6:24 AM  Result Value Ref Range   TSH 0.973 0.350 - 4.500 uIU/mL    Comment: Performed by a 3rd Generation assay with a functional sensitivity of <=0.01 uIU/mL. Performed at [facility], [address] [place], [place] [id number]   Lipid panel     Status: Abnormal   Collection Time: 06/20/16  6:24 AM  Result Value Ref Range   Cholesterol 229 (H) 0 - 200 mg/dL   Triglycerides 191163 (H) <150 mg/dL   HDL 45 >47>40 mg/dL   Total CHOL/HDL Ratio 5.1 RATIO   VLDL 33 0 - 40 mg/dL   LDL Cholesterol 829151 (H) 0 - 99 mg/dL    Comment:        Total Cholesterol/HDL:CHD Risk Coronary Heart  Disease Risk Table  Men   Women  1/2 Average Risk   3.4   3.3  Average Risk       5.0   4.4  2 X Average Risk   9.6   7.1  3 X Average Risk  23.4   11.0        Use the calculated Patient Ratio above and the CHD Risk Table to determine the patient's CHD Risk.        ATP III CLASSIFICATION (LDL):  <100     mg/dL   Optimal  045-409  mg/dL   Near or Above                    Optimal  130-159  mg/dL   Borderline  811-914  mg/dL   High  >782     mg/dL   Very High Performed at [facility], [address], [place], [place] [address]   Hemoglobin A1c     Status: None   Collection Time: 06/20/16  6:24 AM  Result Value Ref Range   Hgb A1c MFr Bld 5.3 4.8 - 5.6 %    Comment: (NOTE)         Pre-diabetes: 5.7 - 6.4         Diabetes: >6.4         Glycemic control for adults with diabetes: <7.0    Mean Plasma Glucose 105 mg/dL    Comment: (NOTE) Performed At: [facility] [address] [place], [place] [id number] Mila Homer MD [id number] Performed at [facility], [address] [place], [place] [address]   Prolactin     Status: None   Collection Time: 06/20/16  6:24 AM  Result Value Ref Range   Prolactin 9.6 4.0 - 15.2 ng/mL    Comment: (NOTE) Performed At: [facility] [address] [place], [place] [id number] Mila Homer MD [id number] Performed at [facility], [address], [place], [place] [address]     Blood Alcohol level:  Lab Results  Component Value Date   ETH <5 06/17/2016    Metabolic Disorder Labs: Lab Results  Component Value Date   HGBA1C 5.3 06/20/2016   MPG 105 06/20/2016   Lab Results  Component Value Date   PROLACTIN 9.6 06/20/2016   Lab Results  Component Value Date   CHOL 229 (H) 06/20/2016   TRIG 163 (H) 06/20/2016   HDL 45 06/20/2016   CHOLHDL 5.1 06/20/2016   VLDL 33 06/20/2016   LDLCALC 151 (H) 06/20/2016    Physical Findings: AIMS:  Facial and Oral Movements Muscles of Facial Expression: None, normal Lips and Perioral Area: None, normal Jaw: None, normal Tongue: None, normal,Extremity Movements Upper (arms, wrists, hands, fingers): None, normal Lower (legs, knees, ankles, toes): None, normal, Trunk Movements Neck, shoulders, hips: None, normal, Overall Severity Severity of abnormal movements (highest score from questions above): None, normal Incapacitation due to abnormal movements: None, normal Patient's awareness of abnormal movements (rate only patient's report): No Awareness, Dental Status Current problems with teeth and/or dentures?: No Does patient usually wear dentures?: No  CIWA:  CIWA-Ar Total: 1 COWS:  COWS Total Score: 2  Musculoskeletal: Strength & Muscle Tone: within normal limits Gait & Station: normal Patient leans: N/A  Psychiatric Specialty Exam: Physical Exam  Nursing note and vitals reviewed. Cardiovascular: Normal rate.   Neurological: He is alert.  Psychiatric: He has a normal mood and affect. His behavior is normal.    Review  of Systems  Psychiatric/Behavioral: Positive for depression and hallucinations. The patient is nervous/anxious and has insomnia.   All other systems reviewed and are negative.   Blood pressure 109/83, pulse 64, temperature 98.9 F (37.2 C), resp. rate 18, height 5\' 11"  (1.803 m), weight 79.4 kg (175 lb), SpO2 100 %.Body mass index is 24.41 kg/m.  General Appearance: Casual and Guarded  Eye Contact:  Minimal  Speech:  Normal Rate  Volume:  Normal  Mood:  Anxious and Dysphoric  Affect:  Congruent  Thought Process:  Irrelevant and Descriptions of Associations: Circumstantial  Orientation:  Full (Time, Place, and Person)  Thought Content:  Delusions, Ideas of Reference:   Delusions, Paranoid Ideation and Rumination  Suicidal Thoughts:  No  Homicidal Thoughts:  No  Memory:  Immediate;   Fair Remote;   Fair  Judgement:  Impaired  Insight:  Shallow   Psychomotor Activity:  Increased  Concentration:  Concentration: Poor and Attention Span: Poor  Recall:  [employer] of Knowledge:  Fair  Language:  Fair  Akathisia:  No  Handed:  Right  AIMS (if indicated):     Assets:  Desire for Improvement Leisure Time Resilience  ADL's:  Intact  Cognition:  WNL  Sleep:  Number of Hours: 6.75    I agree with current treatment plan on 06/21/2016, Patient seen face-to-face for psychiatric evaluation follow-up, chart reviewed. Reviewed the information documented and agree with the treatment plan.  Treatment Plan Summary: Bipolar disorder, curr episode mixed, severe, with psychotic features (HCC) unstable  Will continue today 06/21/16 plan as below except where it is noted.  Daily contact with patient to assess and evaluate symptoms and progress in treatment and Medication management  For psychosis: Continue Abilify to 10 mg po bid.  For mood sx: Continue Lithium 300 mg po bid.  For anxiety sx: Continue Neurontin 400 mg po tid.  For sleep problems: Continue Trazodone to 200 mg po qhs.  For substance abuse do: COWS protocol for opioid abuse. CIWA ativan protocol for BZD abuse.  Labs reviewed -lipid panel - abnormal - recommend diet management.  CSW will work on disposition.  Oneta Rack, NP 06/21/2016, 1:56 PM   Agree wit NP Progress note

## 2016-06-21 NOTE — Progress Notes (Signed)
Recreation Therapy Notes  Date: 06/21/16 Time: 1000 Location: 500 Hall Dayroom  Group Topic: Coping Skills  Goal Area(s) Addresses:  Patients will be able to identify the benefits of coping skills. Patients will be able to identify positive coping skills. Patients will be able to the benefits of using coping skills post d/c.  Behavioral Response: Engaged  Intervention: Audiological scientistBlank spider web, pencils  Activity: OrthoptistWeb Design.  Patients were given a worksheet with a blank spider web on it.  Patients were visualize themselves in the center of the web.  Patients were to then write the obstacles they feel have them stuck within the web. After patients identified their obstacles, they were to come up with coping skills for each obstacles.  Education: PharmacologistCoping Skills, Building control surveyorDischarge Planning.   Education Outcome: Acknowledges understanding/In group clarification offered/Needs additional education.   Clinical Observations/Feedback: Pt stated coping skills "help with everything".  Pt expressed his biggest obstacle is isolating himself.  Pt expressed that he had become so depressed he had locked himself in his room and six months went by.  Pt also expressed he had his family bring him meals when he was hungry.  Pt stated he could cope with isolating himself by taking his medication and seeing a psychiatrist.  Pt said if he uses his positive coping skills it will help "do something I've never done before".    Caroll RancherMarjette Anniece Bleiler, LRT/CTRS     Caroll RancherLindsay, Sahir Tolson A 06/21/2016 11:49 AM

## 2016-06-21 NOTE — [facility] Group Notes (Signed)
.  [facility] LCSW Group Therapy  06/21/2016 , 4:36 PM   Type of Therapy:  Group Therapy  Participation Level:  Active  Participation Quality:  Attentive  Affect:  Appropriate  Cognitive:  Alert  Insight:  Improving  Engagement in Therapy:  Engaged  Modes of Intervention:  Discussion, Exploration and Socialization  Summary of Progress/Problems: Today's group focused on the term Diagnosis.  Participants were asked to define the term, and then pronounce whether it is a negative, positive or neutral term.  Stayed the entire time, engaged throughout.  Able to contribute to the discussion in a meaningful manner, and gave encouragement/positive feedback to others. No sign of psychosis.  Daryel Geraldorth, Ithiel Liebler B 06/21/2016 , 4:36 PM

## 2016-06-21 NOTE — Progress Notes (Signed)
DAR NOTE: Patient presents with flat affect and depressed mood.  Denies auditory and visual hallucinations.  Report suicidal thoughts on self inventory form but contracts for safety. Rates depression at 5, hopelessness at 5, and anxiety at 5.  Maintained on routine safety checks.  Medications given as prescribed.  Support and encouragement offered as needed.  Attended group and participated.  States goal for today is "I don't want to see things that are not there."  Patient observed socializing with peers in the dayroom.

## 2016-06-21 NOTE — Tx Team (Signed)
Interdisciplinary Treatment and Diagnostic Plan Update  06/21/2016 Time of Session: 8:28 AM  Gilles Trimpe MRN: [id number]  Principal Diagnosis: Bipolar disorder, curr episode mixed, severe, with psychotic features ([place])  Secondary Diagnoses: Principal Problem:   Bipolar disorder, curr episode mixed, severe, with psychotic features ([place]) Active Problems:   Opioid dependence in controlled environment ([place])   Mild benzodiazepine use disorder   Current Medications:  Current Facility-Administered Medications  Medication Dose Route Frequency Provider Last Rate Last Dose  . alum & mag hydroxide-simeth (MAALOX/MYLANTA) 200-200-20 MG/5ML suspension 30 mL  30 mL Oral PRN Patrecia Pour, NP      . ARIPiprazole (ABILIFY) tablet 10 mg  10 mg Oral BH-qamhs Ursula Alert, MD   10 mg at 06/21/16 0737  . benztropine (COGENTIN) tablet 0.5 mg  0.5 mg Oral BID Patrecia Pour, NP   0.5 mg at 06/21/16 0737  . cloNIDine (CATAPRES) tablet 0.1 mg  0.1 mg Oral Q8H PRN Benjamine Mola, FNP   0.1 mg at 06/19/16 0844  . dicyclomine (BENTYL) tablet 20 mg  20 mg Oral Q6H PRN Benjamine Mola, FNP      . diphenhydrAMINE (BENADRYL) capsule 25 mg  25 mg Oral Q8H PRN Ursula Alert, MD       Or  . diphenhydrAMINE (BENADRYL) injection 25 mg  25 mg Intramuscular Q8H PRN Ursula Alert, MD      . gabapentin (NEURONTIN) capsule 400 mg  400 mg Oral TID Kerrie Buffalo, NP   400 mg at 06/21/16 0737  . haloperidol (HALDOL) tablet 5 mg  5 mg Oral Q8H PRN Ursula Alert, MD       Or  . haloperidol lactate (HALDOL) injection 5 mg  5 mg Intramuscular Q8H PRN Ursula Alert, MD      . hydrOXYzine (ATARAX/VISTARIL) tablet 25 mg  25 mg Oral Q6H PRN Saramma Eappen, MD      . ibuprofen (ADVIL,MOTRIN) tablet 600 mg  600 mg Oral TID PRN Ursula Alert, MD      . lidocaine (LIDODERM) 5 % 1 patch  1 patch Transdermal Q24H Kerrie Buffalo, NP   1 patch at 06/20/16 1734  . lithium carbonate capsule 300 mg  300 mg Oral BID WC  Ursula Alert, MD   300 mg at 06/21/16 0737  . loperamide (IMODIUM) capsule 2-4 mg  2-4 mg Oral PRN Saramma Eappen, MD      . LORazepam (ATIVAN) tablet 1 mg  1 mg Oral Q6H PRN Saramma Eappen, MD      . LORazepam (ATIVAN) tablet 1 mg  1 mg Oral BID Ursula Alert, MD       Followed by  . [START ON 06/23/2016] LORazepam (ATIVAN) tablet 1 mg  1 mg Oral Daily Saramma Eappen, MD      . magnesium hydroxide (MILK OF MAGNESIA) suspension 30 mL  30 mL Oral Daily PRN Patrecia Pour, NP      . methocarbamol (ROBAXIN) tablet 500 mg  500 mg Oral Q8H PRN Benjamine Mola, FNP      . multivitamin with minerals tablet 1 tablet  1 tablet Oral Daily Ursula Alert, MD   1 tablet at 06/21/16 0737  . ondansetron (ZOFRAN-ODT) disintegrating tablet 4 mg  4 mg Oral Q6H PRN Ursula Alert, MD      . thiamine (VITAMIN B-1) tablet 100 mg  100 mg Oral Daily Ursula Alert, MD   100 mg at 06/21/16 0737  . traZODone (DESYREL) tablet 200 mg  200 mg  Oral QHS Ursula Alert, MD   200 mg at 06/20/16 2106    PTA Medications: Prescriptions Prior to Admission  Medication Sig Dispense Refill Last Dose  . diphenhydrAMINE (BENADRYL) 50 MG capsule Take 100 mg by mouth at bedtime.   06/16/2016 at Unknown time  . HYDROcodone-acetaminophen (NORCO) 10-325 MG tablet Take 1 tablet by mouth four times a day as needed  0     Treatment Modalities: Medication Management, Group therapy, Case management,  1 to 1 session with clinician, Psychoeducation, Recreational therapy.   Physician Treatment Plan for Primary Diagnosis: Bipolar disorder, curr episode mixed, severe, with psychotic features ([place]) Long Term Goal(s): Improvement in symptoms so as ready for discharge  Short Term Goals: Ability to verbalize feelings will improve   Medication Management: Evaluate patient's response, side effects, and tolerance of medication regimen.  Therapeutic Interventions: 1 to 1 sessions, Unit Group sessions and Medication administration.  Evaluation  of Outcomes: Progressing  Physician Treatment Plan for Secondary Diagnosis: Principal Problem:   Bipolar disorder, curr episode mixed, severe, with psychotic features ([place]) Active Problems:   Opioid dependence in controlled environment ([place])   Mild benzodiazepine use disorder   Long Term Goal(s): Improvement in symptoms so as ready for discharge  Short Term Goals: Compliance with prescribed medications will improve  Medication Management: Evaluate patient's response, side effects, and tolerance of medication regimen.  Therapeutic Interventions: 1 to 1 sessions, Unit Group sessions and Medication administration.  Evaluation of Outcomes: Progressing   RN Treatment Plan for Primary Diagnosis: Bipolar disorder, curr episode mixed, severe, with psychotic features ([place]) Long Term Goal(s): Knowledge of disease and therapeutic regimen to maintain health will improve  Short Term Goals: Ability to identify and develop effective coping behaviors will improve and Compliance with prescribed medications will improve  Medication Management: RN will administer medications as ordered by provider, will assess and evaluate patient's response and provide education to patient for prescribed medication. RN will report any adverse and/or side effects to prescribing provider.  Therapeutic Interventions: 1 on 1 counseling sessions, Psychoeducation, Medication administration, Evaluate responses to treatment, Monitor vital signs and CBGs as ordered, Perform/monitor CIWA, COWS, AIMS and Fall Risk screenings as ordered, Perform wound care treatments as ordered.  Evaluation of Outcomes: Progressing   Recreational Therapy Treatment Plan for Primary Diagnosis: Bipolar disorder, curr episode mixed, severe, with psychotic features ([place]) Long Term Goal(s):  LTG- Patient will participate in recreation therapy tx in at least 2 group sessions without prompting from LRT.  Short Term Goals:  Patient will be able to  identify at least 5 coping skills for admitting dx by conclusion of recreation therapy tx.  Treatment Modalities: Group and Pet Therapy  Therapeutic Interventions: Psychoeducation  Evaluation of Outcomes: Progressing   LCSW Treatment Plan for Primary Diagnosis: Bipolar disorder, curr episode mixed, severe, with psychotic features ([place]) Long Term Goal(s): Safe transition to appropriate next level of care at discharge, Engage patient in therapeutic group addressing interpersonal concerns.  Short Term Goals: Engage patient in aftercare planning with referrals and resources  Therapeutic Interventions: Assess for all discharge needs, 1 to 1 time with Social worker, Explore available resources and support systems, Assess for adequacy in community support network, Educate family and significant other(s) on suicide prevention, Complete Psychosocial Assessment, Interpersonal group therapy.  Evaluation of Outcomes: Met  Return home, follow up outpt   Progress in Treatment: Attending groups: Yes Participating in groups: Yes Taking medication as prescribed: Yes Toleration medication: Yes, no side effects reported at this time  Family/Significant other contact made: No Patient understands diagnosis: Yes AEB asking for help with depression; limited insight into psychosis Discussing patient identified problems/goals with staff: Yes Medical problems stabilized or resolved: Yes Denies suicidal/homicidal ideation: Yes Issues/concerns per patient self-inventory: None Other: N/A  New problem(s) identified: None identified at this time.   New Short Term/Long Term Goal(s): None identified at this time.   Discharge Plan or Barriers:   Reason for Continuation of Hospitalization: Delusions  Depression Hallucinations  Medication stabilization   Estimated Length of Stay: 3-5 days  Attendees: Patient: 06/21/2016  8:28 AM  Physician: Ursula Alert, MD 06/21/2016  8:28 AM  Nursing: Hoy Register, RN 06/21/2016  8:28 AM  RN Care Manager: Lars Pinks, RN 06/21/2016  8:28 AM  Social Worker: Ripley Fraise 06/21/2016  8:28 AM  Recreational Therapist: Laretta Bolster  06/21/2016  8:28 AM  Other: Norberto Sorenson 06/21/2016  8:28 AM  Other:  06/21/2016  8:28 AM    Scribe for Treatment Team:  Roque Lias LCSW 06/21/2016 8:28 AM

## 2016-06-21 NOTE — Progress Notes (Signed)
Recreation Therapy Notes  INPATIENT RECREATION THERAPY ASSESSMENT  Patient Details Name: Lucretia RoersFrederick Allen Trimble MRN: [id number] DOB: Dec 15, 1965 Today's Date: 06/21/2016  Patient Stressors: Work  Pt stated he was here because he had ringing in his ear and a drone was trying to reach him from the [employer] family. Pt stated he used to be able to perform well at work.  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

## 2016-06-22 ENCOUNTER — Encounter (HOSPITAL_COMMUNITY): Payer: Self-pay | Admitting: Emergency Medicine

## 2016-06-22 LAB — GLUCOSE, CAPILLARY: GLUCOSE-CAPILLARY: 88 mg/dL (ref 65–99)

## 2016-06-22 MED ORDER — TETANUS-DIPHTH-ACELL PERTUSSIS 5-2.5-18.5 LF-MCG/0.5 IM SUSP
0.5000 mL | Freq: Once | INTRAMUSCULAR | Status: AC
  Administered 2016-06-22: 0.5 mL via INTRAMUSCULAR
  Filled 2016-06-22: qty 0.5

## 2016-06-22 MED ORDER — ZOLPIDEM TARTRATE 5 MG PO TABS: 5.0000 mg | ORAL_TABLET | Freq: Every evening | ORAL | Status: DC | PRN

## 2016-06-22 MED ORDER — SODIUM CHLORIDE 0.9 % IV BOLUS (SEPSIS)
1000.0000 mL | Freq: Once | INTRAVENOUS | Status: AC
  Administered 2016-06-23: 1000 mL via INTRAVENOUS

## 2016-06-22 MED ORDER — ZOLPIDEM TARTRATE 5 MG PO TABS
5.0000 mg | ORAL_TABLET | Freq: Every day | ORAL | Status: DC
  Filled 2016-06-22: qty 1

## 2016-06-22 MED ORDER — LIDOCAINE HCL (PF) 1 % IJ SOLN
5.0000 mL | Freq: Once | INTRAMUSCULAR | Status: AC
Start: 2016-06-22 — End: 2016-06-22
  Administered 2016-06-22: 5 mL
  Filled 2016-06-22: qty 5

## 2016-06-22 NOTE — Progress Notes (Signed)
Recreation Therapy Notes  Date: 06/22/16 Time: 1100 Location: 500 Hall Dayroom  Group Topic: Self-Esteem  Goal Area(s) Addresses:  Patient will identify positive ways to increase self-esteem. Patient will verbalize benefit of increased self-esteem.  Behavioral Response: Engaged  Intervention: Blank crest, colored pencils, at least 6 topics  Activity: Patients were given Dominguez sheet with Dominguez blank crest on it.  The crest was divided into 4 areas.  LRT wrote 6 topics on the board for the patients to choose from.  Patients were to pick 4 of the 6 topics to represent something about them on their crest.    Education:  Self-Esteem, Discharge Planning.   Education Outcome: Acknowledges education/In group clarification offered/Needs additional education  Clinical Observations/Feedback: Pt stated self-esteem is important because "it impacts how you handle things".  Pt went on to say that self-esteem is affected "when you master something you become confident because you have accomplished something".  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  Pt found the activity [name] because he had to use skills he hadn't used in Dominguez while.  Pt stated focusing on his positive attributes will help him "get past going back to work and learn how to delegate".     Charles Dominguez, LRT/CTRS    Charles RancherLindsay, Charles Dominguez 06/22/2016 12:29 PM

## 2016-06-22 NOTE — [facility] Group Notes (Signed)
[facility] LCSW Group Therapy  06/22/2016 1:15 pm  Type of Therapy: Process Group Therapy  Participation Level:  Active  Participation Quality:  Appropriate  Affect:  Flat  Cognitive:  Oriented  Insight:  Improving  Engagement in Group:  Limited  Engagement in Therapy:  Limited  Modes of Intervention:  Activity, Clarification, Education, Problem-solving and Support  Summary of Progress/Problems: Today's group addressed the issue of overcoming obstacles.  Patients were asked to identify their biggest obstacle post d/c that stands in the way of their on-going success, and then problem solve as to how to manage this. Stayed the entire time, engaged throughout.  "I was depressed for a long time before coming in, like 6 months, and I hope I don't get depressed again when I get home."   Group focused on how he stays occupied.  Stated he used to work, but hasn't for 6 months.  But then brightened significantly when talking about watching his grandchildren.  Also agreed that continuing to take meds is probably an important thing.  Daryel Geraldorth, Rigdon Macomber B 06/22/2016   2:13 PM

## 2016-06-22 NOTE — ED Notes (Signed)
Pt laceration's extends from inside of his bottom lip to the outside of his bottom lip. Pt also reports not sleeping more than 2 hours every night for the past week.

## 2016-06-22 NOTE — Progress Notes (Signed)
At approximately 2105 pt was at the medication window about to take his medications when he leaned into the window and knocked his water on rn   He did not receive his medications   He appeared to be in distresss so rn said he should sit down but before rn could get out of the medication room to help him he fell to the floor face first   He his his nose and it was bleeding no other injury was noted   Pt feet were propped up and a folded towel placed under his head   He was mostly disoriented and couold not answer questions at first    EMS called     wife notified around 2115   After EMS worked with him he was a little more responsive    Pt [facility] for [facility] treatment     Prior to the fall pt was alert and oriented   He was answering questions appropriately and discussing his medications and treatment   The fall was witnessed by several staff including the PA

## 2016-06-22 NOTE — ED Provider Notes (Signed)
[facility]-EMERGENCY DEPT Provider Note   CSN: [id number] Arrival date & time: 06/22/16  2159  History   Chief Complaint Chief Complaint  Patient presents with  . Loss of Consciousness   HPI   Charles Dominguez is an 51 y.o. male with history of bipolar disorder and schizoaffective disorder who is currently a patient at [facility] He presents to the ED from [facility] tonight for evaluation after a syncopal episode. Pt states he was woken up for his medications by the nurse. States he stood up out of bed and as he was standing started feeling lightheaded. He states he remembers walking towards the window, then falling forward. The next thing he remembers is waking up on the floor. He was a bit confused upon waking. Pt states he has never passed out before. He states he is also taking a few new psych meds which he has never taken before. Last dose of 5:00 PM meds included gabapentin, lithium, and cogentin. Denies and preceding or post syncopal chest pain though feels a bit sore in his lower right ribs where he hit the floor. Denies any SOB. Denies fam hx of cardiac history or cardiac death at a young age. Denies any abdominal pain, nausea, vomiting. He does state he has not been sleeping much at [facility] and is also not eating much as he does not like the food. Is unsure of his last tetanus vaccine.  Past Medical History:  Diagnosis Date  . Back muscle spasm   . Shoulder pain     Patient Active Problem List   Diagnosis Date Noted  . Bipolar disorder, curr episode mixed, severe, with psychotic features (HCC) 06/19/2016  . Opioid dependence in controlled environment (HCC) 06/19/2016  . Mild benzodiazepine use disorder 06/19/2016    Past Surgical History:  Procedure Laterality Date  . RETINAL TEAR REPAIR CRYOTHERAPY     both eyes has surgery  . SHOULDER SURGERY      Home Medications    Prior to Admission medications   Medication Sig Start Date End Date Taking? Authorizing Provider  diphenhydrAMINE  (BENADRYL) 50 MG capsule Take 100 mg by mouth at bedtime.   Yes Historical Provider, MD  HYDROcodone-acetaminophen [facility]) 10-325 MG tablet Take 1 tablet by mouth four times a day as needed 06/04/16   Historical Provider, MD    Family History Family History  Problem Relation Age of Onset  . Bipolar disorder Mother   . Bipolar disorder Maternal Aunt     Social History Social History  Substance Use Topics  . Smoking status: Never Smoker  . Smokeless tobacco: Never Used  . Alcohol use No    Allergies   Patient has no known allergies.   Review of Systems Review of Systems 10 Systems reviewed and are negative for acute change except as noted in the HPI.  Physical Exam Updated Vital Signs BP (!) 135/104 (BP Location: Left Arm)   Pulse 88   Temp 98.7 F (37.1 C) (Oral)   Resp (!) 8   Ht 5' 10.75" (1.797 m)   Wt 77.1 kg   SpO2 100%   BMI 23.88 kg/m   Physical Exam  Constitutional: He is oriented to person, place, and time.  HENT:  Right Ear: External ear normal.  Left Ear: External ear normal.  Nose: Nose normal.  Mouth/Throat: Oropharynx is clear and moist. No oropharyngeal exudate.  Chin with 1cm external lac, well aligned, extends to subcutaneous tissue. Intraoral exam with inner lower lip lac. Despite nursing note,  it is NOT truly through and through. No active bleeding. No foreign bodies. No loose teeth. No other intraoral trauma.  Eyes: Conjunctivae and EOM are normal. Pupils are equal, round, and reactive to light.  Neck: Normal range of motion. Neck supple.  No c-spine tenderness  Cardiovascular: Normal rate, regular rhythm, normal heart sounds and intact distal pulses.   Pulmonary/Chest: Effort normal and breath sounds normal. No respiratory distress. He has no wheezes. He exhibits tenderness.  Mild tenderness to lower anterior right chest wall  Abdominal: Soft. Bowel sounds are normal. He exhibits no distension. There is no tenderness. There is no rebound and no  guarding.  Musculoskeletal: He exhibits no edema.  Lymphadenopathy:    He has no cervical adenopathy.  Neurological: He is alert and oriented to person, place, and time. No cranial nerve deficit.  Skin: Skin is warm and dry.  Psychiatric: He has a normal mood and affect.  Nursing note and vitals reviewed.  Orthostatic VS for the past 24 hrs:  BP- Lying Pulse- Lying BP- Sitting Pulse- Sitting BP- Standing at 0 minutes Pulse- Standing at 0 minutes  06/23/16 0059 118/82 94 (!) 128/98 104 (!) 139/98 108       ED Treatments / Results  Labs (all labs ordered are listed, but only abnormal results are displayed) Labs Reviewed  LIPID PANEL - Abnormal; Notable for the following:       Result Value   Cholesterol 229 (*)    Triglycerides 163 (*)    LDL Cholesterol 151 (*)    All other components within normal limits  BASIC METABOLIC PANEL - Abnormal; Notable for the following:    Glucose, Bld 103 (*)    Creatinine, Ser 1.29 (*)    All other components within normal limits  CBC WITH DIFFERENTIAL/PLATELET - Abnormal; Notable for the following:    Hemoglobin 12.9 (*)    HCT 38.7 (*)    All other components within normal limits  TSH  HEMOGLOBIN A1C  PROLACTIN  GLUCOSE, CAPILLARY  LITHIUM LEVEL  CBG MONITORING, ED    EKG  EKG Interpretation  Date/Time:  Tuesday June 22 2016 22:14:15 EST Ventricular Rate:  95 PR Interval:    QRS Duration: 90 QT Interval:  398 QTC Calculation: 501 R Axis:   11 Text Interpretation:  Sinus rhythm Prolonged QT interval When compared with ECG of EARLIER SAME DATE No significant change was found Confirmed by [facility]  MD, DAVID ([id number]) on 06/22/2016 11:42:54 PM       Radiology Dg Chest 2 View  Result Date: 06/23/2016 CLINICAL DATA:  Status post syncope and fall. Injury to the right anterior lower chest. Initial encounter. EXAM: CHEST  2 VIEW COMPARISON:  Chest radiograph performed 06/20/2014 FINDINGS: The lungs are well-aerated and clear. There is  no evidence of focal opacification, pleural effusion or pneumothorax. The heart is normal in size; the mediastinal contour is within normal limits. No acute osseous abnormalities are seen. IMPRESSION: No acute cardiopulmonary process seen. No displaced rib fractures identified. Electronically Signed   By: Roanna Raider M.D.   On: 06/23/2016 00:40    Procedures .Marland KitchenLaceration Repair Date/Time: 06/23/2016 12:54 AM Performed by: Carlene Coria Authorized by: Carlene Coria   Consent:    Consent obtained:  Verbal   Consent given by:  Patient   Risks discussed:  Pain, poor cosmetic result, poor wound healing, need for additional repair, infection and retained foreign body   Alternatives discussed:  No treatment Anesthesia (see MAR for  exact dosages):    Anesthesia method:  Local infiltration   Local anesthetic:  Lidocaine 1% w/o epi (2cc) Laceration details:    Location:  Face   Face location:  Chin   Length (cm):  1 Repair type:    Repair type:  Simple Pre-procedure details:    Preparation:  Patient was prepped and draped in usual sterile fashion Exploration:    Hemostasis achieved with:  Direct pressure   Wound exploration: entire depth of wound probed and visualized     Contaminated: no   Treatment:    Area cleansed with:  Hibiclens and saline   Amount of cleaning:  Standard   Irrigation solution:  Sterile saline   Visualized foreign bodies/material removed: no   Skin repair:    Repair method:  Sutures   Suture size:  5-0   Wound skin closure material used: rapid vicryl.   Suture technique:  Simple interrupted   Number of sutures:  2 Approximation:    Approximation:  Close   Vermilion border: well-aligned   Post-procedure details:    Dressing:  Antibiotic ointment and sterile dressing   Patient tolerance of procedure:  Tolerated well, no immediate complications   (including critical care time)  Medications Ordered in ED Medications  magnesium hydroxide (MILK OF MAGNESIA)  suspension 30 mL (not administered)  alum & mag hydroxide-simeth (MAALOX/MYLANTA) 200-200-20 MG/5ML suspension 30 mL (not administered)  benztropine (COGENTIN) tablet 0.5 mg (0.5 mg Oral Given 06/22/16 1658)  gabapentin (NEURONTIN) capsule 400 mg (400 mg Oral Given 06/22/16 1658)  lidocaine (LIDODERM) 5 % 1 patch (1 patch Transdermal Patch Applied 06/22/16 1658)  dicyclomine (BENTYL) tablet 20 mg (not administered)  methocarbamol (ROBAXIN) tablet 500 mg (not administered)  cloNIDine (CATAPRES) tablet 0.1 mg (0.1 mg Oral Given 06/19/16 0844)  lithium carbonate capsule 300 mg (300 mg Oral Given 06/22/16 1658)  haloperidol (HALDOL) tablet 5 mg (not administered)    Or  haloperidol lactate (HALDOL) injection 5 mg (not administered)  diphenhydrAMINE (BENADRYL) capsule 25 mg (not administered)    Or  diphenhydrAMINE (BENADRYL) injection 25 mg (not administered)  thiamine (VITAMIN B-1) tablet 100 mg (100 mg Oral Given 06/22/16 0746)  multivitamin with minerals tablet 1 tablet (1 tablet Oral Given 06/22/16 0746)  LORazepam (ATIVAN) tablet 1 mg (not administered)  hydrOXYzine (ATARAX/VISTARIL) tablet 25 mg (25 mg Oral Given 06/22/16 0203)  loperamide (IMODIUM) capsule 2-4 mg (not administered)  ondansetron (ZOFRAN-ODT) disintegrating tablet 4 mg (not administered)  LORazepam (ATIVAN) tablet 1 mg (1 mg Oral Given 06/20/16 0811)    Followed by  LORazepam (ATIVAN) tablet 1 mg (1 mg Oral Given 06/21/16 0737)    Followed by  LORazepam (ATIVAN) tablet 1 mg (1 mg Oral Given 06/22/16 0746)    Followed by  LORazepam (ATIVAN) tablet 1 mg (not administered)  ibuprofen (ADVIL,MOTRIN) tablet 600 mg (not administered)  ARIPiprazole (ABILIFY) tablet 10 mg (10 mg Oral Not Given 06/22/16 2259)  traZODone (DESYREL) tablet 200 mg (200 mg Oral Not Given 06/22/16 2259)  zolpidem (AMBIEN) tablet 5 mg (5 mg Oral Not Given 06/22/16 2259)  lidocaine (PF) (XYLOCAINE) 1 % injection 5 mL (not administered)  Tdap (BOOSTRIX) injection  0.5 mL (not administered)  sodium chloride 0.9 % bolus 1,000 mL (not administered)     Initial Impression / Assessment and Plan / ED Course  I have reviewed the triage vital signs and the nursing notes.  Pertinent labs & imaging results that were available during my care of the patient were  reviewed by me and considered in my medical decision making (see chart for details).    [facility]. Could certainly be related to his new meds, but also sounds a bit orthostatic given pt's report of standing quickly and feeling lightheaded as he was standing. We'll check an EKG (though previous EKG from earlier today unremarkable), basic labs to r/o anemia or electrolyte derangement (suspicion is low). CXR esp given pt's tenderness to r/o rib fx, pneumothorax. Check orthostatics. We'll update pt's tdap and i'll suture his lac. Will give fluids and reassess.  1:05 AM Labs reveal some renal insufficiency, otherwise unrevealing. CXR negative for acute findings. Pt feeling improved with fluids. We'll offer fluid here. He has ambulated without problems. He does have some orthostasis, and with his poor PO intake and lack of sleep pretty good picture for syncopal episode. Doubt emergent cardiopulmonary etiology requiring further evaluation. [facility].  Final Clinical Impressions(s) / ED Diagnoses   Final diagnoses:  Syncope, unspecified syncope type  Chin laceration, initial encounter  Renal insufficiency    New Prescriptions New Prescriptions   No medications on file     Derl BarrowSerena Y Osborn Pullin, PA-C 06/23/16 0116    Dione Boozeavid Glick, MD 06/23/16 [phone]

## 2016-06-22 NOTE — Progress Notes (Signed)
Adult Psychoeducational Group Note  Date:  06/22/2016 Time:  8:23 PM  Group Topic/Focus:  Wrap-Up Group:   The focus of this group is to help patients review their daily goal of treatment and discuss progress on daily workbooks.  Participation Level:  Active  Participation Quality:  Appropriate  Affect:  Appropriate  Cognitive:  Alert  Insight: Appropriate  Engagement in Group:  Engaged  Modes of Intervention:  Discussion  Additional Comments:  Patient states, "my day was good". Patient also stated that he feels much better. Patient's goal for today was to stay focus, stay positive, and stay active.   Isaiah Torok L Kelsa Jaworowski 06/22/2016, 8:23 PM

## 2016-06-22 NOTE — Progress Notes (Signed)
[facility] MD Progress Note  06/22/2016 12:38 PM Charles Dominguez  MRN:  [id number]   Subjective:  Patient states " I still feel that my birthmark is transmitting signals - but its not that bad, I am not too focussed on it like I were before. I still have sleep issues."   Objective:Charles Dominguez is seen in the dayroom. Pt continues to have his birthmark on his hand covered with band aid . Pt continues to have delusions that he is getting signals through his birthmark and that he needs to help someone in [place]. Pt continues to have increased restlessness and the need to connect with this communication at night and he states that it has been affecting his sleep. Will add another sleep aid like Ambien . His QT was prolonged - but QTC was wnl - hence will repeat EKG prior to readjusting his current psychotropics.      Principal Problem: Bipolar disorder, curr episode mixed, severe, with psychotic features (HCC) Diagnosis:   Patient Active Problem List   Diagnosis Date Noted  . Bipolar disorder, curr episode mixed, severe, with psychotic features (HCC) [F31.64] 06/19/2016  . Opioid dependence in controlled environment (HCC) [F11.20] 06/19/2016  . 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   Past Medical History:  Past Medical History:  Diagnosis Date  . Back muscle spasm   . Shoulder pain     Past Surgical History:  Procedure Laterality Date  . RETINAL TEAR REPAIR CRYOTHERAPY     both eyes has surgery  . SHOULDER SURGERY     Family History:  Family History  Problem Relation Age of Onset  . Bipolar disorder Mother   . Bipolar disorder Maternal Aunt    Family Psychiatric  History: Please see H&P.  Social History: Please see H&P.  History  Alcohol Use No     History  Drug Use No    Social History   Social History  . Marital status: Married    Spouse name:  N/A  . Number of children: N/A  . Years of education: N/A   Social History Main Topics  . Smoking status: Never Smoker  . Smokeless tobacco: Never Used  . Alcohol use No  . Drug use: No  . Sexual activity: Yes    Birth control/ protection: None   Other Topics Concern  . None   Social History Narrative  . None   Additional Social History:    Pain Medications: Norco tid for shoulder pain Prescriptions: Norco and Xanax Over the Counter: None History of alcohol / drug use?: No history of alcohol / drug abuse                    Sleep: restless  Appetite:  Fair  Current Medications: Current Facility-Administered Medications  Medication Dose Route Frequency Provider Last Rate Last Dose  . alum & mag hydroxide-simeth (MAALOX/MYLANTA) 200-200-20 MG/5ML suspension 30 mL  30 mL Oral PRN Charm Rings, NP      . ARIPiprazole (ABILIFY) tablet 10 mg  10 mg Oral BH-qamhs Jomarie Longs, MD   10 mg at 06/22/16 0743  . benztropine (COGENTIN) tablet 0.5 mg  0.5 mg Oral BID Charm Rings, NP   0.5 mg at 06/22/16 0744  . cloNIDine (CATAPRES) tablet 0.1 mg  0.1 mg Oral Q8H PRN Beau Fanny, FNP   0.1 mg at 06/19/16 0844  .  dicyclomine (BENTYL) tablet 20 mg  20 mg Oral Q6H PRN Beau FannyJohn C Withrow, FNP      . diphenhydrAMINE (BENADRYL) capsule 25 mg  25 mg Oral Q8H PRN Jomarie LongsSaramma Keian Odriscoll, MD       Or  . diphenhydrAMINE (BENADRYL) injection 25 mg  25 mg Intramuscular Q8H PRN Jomarie LongsSaramma Yuriana Gaal, MD      . gabapentin (NEURONTIN) capsule 400 mg  400 mg Oral TID Adonis BrookSheila Agustin, NP   400 mg at 06/22/16 1159  . haloperidol (HALDOL) tablet 5 mg  5 mg Oral Q8H PRN Jomarie LongsSaramma Kirkland Figg, MD       Or  . haloperidol lactate (HALDOL) injection 5 mg  5 mg Intramuscular Q8H PRN Shaquille Janes, MD      . hydrOXYzine (ATARAX/VISTARIL) tablet 25 mg  25 mg Oral Q6H PRN Jomarie LongsSaramma Yesli Vanderhoff, MD   25 mg at 06/22/16 0203  . ibuprofen (ADVIL,MOTRIN) tablet 600 mg  600 mg Oral TID PRN Jomarie LongsSaramma Romonda Parker, MD      . lidocaine  (LIDODERM) 5 % 1 patch  1 patch Transdermal Q24H Adonis BrookSheila Agustin, NP   1 patch at 06/21/16 1736  . lithium carbonate capsule 300 mg  300 mg Oral BID WC Jomarie LongsSaramma Destine Ambroise, MD   300 mg at 06/22/16 0744  . loperamide (IMODIUM) capsule 2-4 mg  2-4 mg Oral PRN Jomarie LongsSaramma Bobbie Valletta, MD      . LORazepam (ATIVAN) tablet 1 mg  1 mg Oral Q6H PRN Jomarie LongsSaramma Leilyn Frayre, MD      . Melene Muller[START ON 06/23/2016] LORazepam (ATIVAN) tablet 1 mg  1 mg Oral Daily Gerado Nabers, MD      . magnesium hydroxide (MILK OF MAGNESIA) suspension 30 mL  30 mL Oral Daily PRN Charm RingsJamison Y Lord, NP      . methocarbamol (ROBAXIN) tablet 500 mg  500 mg Oral Q8H PRN Beau FannyJohn C Withrow, FNP      . multivitamin with minerals tablet 1 tablet  1 tablet Oral Daily Jomarie LongsSaramma Adysen Raphael, MD   1 tablet at 06/22/16 0746  . ondansetron (ZOFRAN-ODT) disintegrating tablet 4 mg  4 mg Oral Q6H PRN Jomarie LongsSaramma Wendall Isabell, MD      . thiamine (VITAMIN B-1) tablet 100 mg  100 mg Oral Daily Olanrewaju Osborn, MD   100 mg at 06/22/16 0746  . traZODone (DESYREL) tablet 200 mg  200 mg Oral QHS Jomarie LongsSaramma Eliud Polo, MD   200 mg at 06/21/16 2051  . zolpidem (AMBIEN) tablet 5 mg  5 mg Oral QHS PRN Jomarie LongsSaramma Corliss Coggeshall, MD        Lab Results:  No results found for this or any previous visit (from the past 48 hour(s)).  Blood Alcohol level:  Lab Results  Component Value Date   ETH <5 06/17/2016    Metabolic Disorder Labs: Lab Results  Component Value Date   HGBA1C 5.3 06/20/2016   MPG 105 06/20/2016   Lab Results  Component Value Date   PROLACTIN 9.6 06/20/2016   Lab Results  Component Value Date   CHOL 229 (H) 06/20/2016   TRIG 163 (H) 06/20/2016   HDL 45 06/20/2016   CHOLHDL 5.1 06/20/2016   VLDL 33 06/20/2016   LDLCALC 151 (H) 06/20/2016    Physical Findings: AIMS: Facial and Oral Movements Muscles of Facial Expression: None, normal Lips and Perioral Area: None, normal Jaw: None, normal Tongue: None, normal,Extremity Movements Upper (arms, wrists, hands, fingers): None, normal Lower  (legs, knees, ankles, toes): None, normal, Trunk Movements Neck, shoulders, hips: None, normal, Overall Severity Severity of  abnormal movements (highest score from questions above): None, normal Incapacitation due to abnormal movements: None, normal Patient's awareness of abnormal movements (rate only patient's report): No Awareness, Dental Status Current problems with teeth and/or dentures?: No Does patient usually wear dentures?: No  CIWA:  CIWA-Ar Total: 1 COWS:  COWS Total Score: 3  Musculoskeletal: Strength & Muscle Tone: within normal limits Gait & Station: normal Patient leans: N/A  Psychiatric Specialty Exam: Physical Exam  Nursing note and vitals reviewed.   Review of Systems  Psychiatric/Behavioral: Positive for depression. The patient is nervous/anxious and has insomnia.   All other systems reviewed and are negative.   Blood pressure 103/73, pulse (!) 120, temperature 98.9 F (37.2 C), temperature source Oral, resp. rate 18, height 5\' 11"  (1.803 m), weight 79.4 kg (175 lb), SpO2 100 %.Body mass index is 24.41 kg/m.  General Appearance: Casual and Guarded  Eye Contact:  Fair  Speech:  Normal Rate  Volume:  Normal  Mood:  Anxious  Affect:  Congruent  Thought Process:  Irrelevant and Descriptions of Associations: Circumstantial  Orientation:  Full (Time, Place, and Person)  Thought Content:  Delusions, Ideas of Reference:   Delusions, Paranoid Ideation and Rumination  Suicidal Thoughts:  No  Homicidal Thoughts:  No  Memory:  Immediate;   Fair Recent;   Fair Remote;   Fair  Judgement:  Fair  Insight:  Shallow  Psychomotor Activity:  Normal  Concentration:  Concentration: Fair and Attention Span: Fair  Recall:  [employer] of Knowledge:  Fair  Language:  Fair  Akathisia:  No  Handed:  Right  AIMS (if indicated):     Assets:  Desire for Improvement Leisure Time Resilience  ADL's:  Intact  Cognition:  WNL  Sleep:  Number of Hours: 4.25    Treatment Plan  Summary: Bipolar disorder, curr episode mixed, severe, with psychotic features (HCC) unstable  Will continue today 06/22/16 plan as below except where it is noted.  Daily contact with patient to assess and evaluate symptoms and progress in treatment and Medication management  For psychosis: Continue Abilify to 10 mg po bid.Will repeat EKG prior to increasing the dose.  For mood sx: Continue Lithium 300 mg po bid.Li level on 06/23/16.  For anxiety sx: Continue Neurontin 400 mg po tid.  For sleep problems: Continue Trazodone to 200 mg po qhs. Will Add Ambien 5 mg po qhs .  For substance abuse do: COWS protocol for opioid abuse. CIWA ativan protocol for BZD abuse.  Labs reviewed -lipid panel - abnormal - recommend diet management.  CSW will work on disposition.  Charles Pikus, MD 06/22/2016, 12:38 PM

## 2016-06-22 NOTE — Progress Notes (Signed)
DAR NOTE: Patient presents with anxious affect and mood.  Denies pain, auditory and visual hallucinations.  Described energy level as low and concentration as poor.  Rates depression at 3, hopelessness at 4, and anxiety at 8.  Maintained on routine safety checks.  Medications given as prescribed.  Support and encouragement offered as needed.  Attended group and participated.  States goal for today is "stay positive."  Patient observed socializing with peers in the dayroom.  Offered no complaint.

## 2016-06-22 NOTE — Progress Notes (Signed)
Charles Dominguez. [name] had been up and visible in milieu this evening, did attend evening group activity. [name] had visit from family earlier that he stated went well. [name] spoke about how he slept better last night but still feels slightly anxious and spoke about how he does not hear voices screaming at him as they once did and also spoke about how he still feels a signal from his hand. [name] spoke about how the voices were screaming at him for 3 years and he got to the point where he couldn't take it any longer and reports that he is starting to feel better. [name] did receive bedtime medications without incident and did not verbalize any complaints of pain. A. Support and encouragement provided. R. Safety maintained, will continue to monitor.

## 2016-06-22 NOTE — ED Triage Notes (Signed)
[facility]. Around 2130 tonight pt had a witnessed syncopal episode where he lost consciousness for about 2 minutes. Pt fell forward into the ground. Has a laceration on his bottom lip/chin. Before episode pt stated he felt like he was about to pass out with a weak, tired feeling. Pt was confused upon waking up. Pt denies any pain. Pt had 1700 medications: 400 gabapentin, 400 lithium, 500 cogentin. EMS CBG 119.

## 2016-06-23 ENCOUNTER — Inpatient Hospital Stay (HOSPITAL_COMMUNITY): Payer: Medicaid Other

## 2016-06-23 LAB — CBC WITH DIFFERENTIAL/PLATELET
Basophils Absolute: 0 10*3/uL (ref 0.0–0.1)
Basophils Relative: 0 %
EOS PCT: 0 %
Eosinophils Absolute: 0 10*3/uL (ref 0.0–0.7)
HEMATOCRIT: 38.7 % — AB (ref 39.0–52.0)
HEMOGLOBIN: 12.9 g/dL — AB (ref 13.0–17.0)
LYMPHS ABS: 1.5 10*3/uL (ref 0.7–4.0)
LYMPHS PCT: 17 %
MCH: 30.1 pg (ref 26.0–34.0)
MCHC: 33.3 g/dL (ref 30.0–36.0)
MCV: 90.2 fL (ref 78.0–100.0)
Monocytes Absolute: 0.5 10*3/uL (ref 0.1–1.0)
Monocytes Relative: 6 %
NEUTROS ABS: 6.6 10*3/uL (ref 1.7–7.7)
NEUTROS PCT: 77 %
Platelets: 241 10*3/uL (ref 150–400)
RBC: 4.29 MIL/uL (ref 4.22–5.81)
RDW: 13.2 % (ref 11.5–15.5)
WBC: 8.6 10*3/uL (ref 4.0–10.5)

## 2016-06-23 LAB — BASIC METABOLIC PANEL
Anion gap: 13 (ref 5–15)
BUN: 10 mg/dL (ref 6–20)
CHLORIDE: 102 mmol/L (ref 101–111)
CO2: 26 mmol/L (ref 22–32)
Calcium: 10 mg/dL (ref 8.9–10.3)
Creatinine, Ser: 1.29 mg/dL — ABNORMAL HIGH (ref 0.61–1.24)
GFR calc Af Amer: >60 mL/min (ref >60)
GFR calc non Af Amer: >60 mL/min (ref >60)
Glucose, Bld: 103 mg/dL — ABNORMAL HIGH (ref 65–99)
POTASSIUM: 3.8 mmol/L (ref 3.5–5.1)
SODIUM: 141 mmol/L (ref 135–145)

## 2016-06-23 LAB — CBG MONITORING, ED: Glucose-Capillary: 146 mg/dL — ABNORMAL HIGH (ref 65–99)

## 2016-06-23 MED ORDER — TRAZODONE HCL 100 MG PO TABS: 200.0000 mg | ORAL_TABLET | Freq: Every day | ORAL | 0 refills | Status: AC

## 2016-06-23 MED ORDER — GABAPENTIN 400 MG PO CAPS: 400.0000 mg | ORAL_CAPSULE | Freq: Three times a day (TID) | ORAL | 0 refills | Status: AC

## 2016-06-23 MED ORDER — LIDOCAINE 5 % EX PTCH: 1.0000 | MEDICATED_PATCH | CUTANEOUS | 0 refills | Status: AC

## 2016-06-23 MED ORDER — BENZTROPINE MESYLATE 0.5 MG PO TABS: 0.5000 mg | ORAL_TABLET | Freq: Two times a day (BID) | ORAL | 0 refills | Status: AC

## 2016-06-23 MED ORDER — ARIPIPRAZOLE 5 MG PO TABS: 5.0000 mg | ORAL_TABLET | ORAL | 0 refills | Status: AC

## 2016-06-23 MED ORDER — ARIPIPRAZOLE 5 MG PO TABS
5.0000 mg | ORAL_TABLET | ORAL | Status: DC
Start: 2016-06-23 — End: 2016-06-23
  Filled 2016-06-23 (×2): qty 7
  Filled 2016-06-23: qty 1
  Filled 2016-06-23: qty 7
  Filled 2016-06-23: qty 1

## 2016-06-23 MED ORDER — LITHIUM CARBONATE 300 MG PO CAPS: 300.0000 mg | ORAL_CAPSULE | Freq: Two times a day (BID) | ORAL | 0 refills | Status: AC

## 2016-06-23 NOTE — Progress Notes (Signed)
Received pt back from ER around 3am   He was in stable condition  And good spirits   He received medication to assist him in returning to sleep   Will continue to follow protocal for high fall risk and post fall evaluations  Pt safe at present

## 2016-06-23 NOTE — ED Notes (Signed)
Patient transported to X-ray 

## 2016-06-23 NOTE — Progress Notes (Signed)
Recreation Therapy Notes  Date: 06/23/16 Time: 1000 Location: 500 Hall Dayroom  Group Topic: Wellness  Goal Area(s) Addresses:  Patient will define components of whole wellness. Patient will verbalize benefit of whole wellness.  Behavioral Response: Engaged  Intervention: 2 decks of cards  Activity: Deck of Chance.  Patients were given 5 cards from one deck of cards.  LRT would pull a card from the second deck of cards.  Whatever number the LRT pulled, if a patient had a card matching that number, they would have to do an exercise.  Education: Wellness, Building control surveyorDischarge Planning.   Education Outcome: Acknowledges education/In group clarification offered/Needs additional education.   Clinical Observations/Feedback: Pt was bright.  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  Pt stated focusing on his physical health would "release serotonin and help me to feel healthier, happy and be more physical."   Caroll RancherMarjette Dewaine Morocho, LRT/CTRS         Caroll RancherLindsay, Everlie Eble A 06/23/2016 11:27 AM

## 2016-06-23 NOTE — [facility] Suicide Risk Assessment (Signed)
[facility]   Principal Problem: Bipolar disorder, curr episode mixed, severe, with psychotic features [facility](HCC) Discharge Diagnoses:  Patient Active Problem List   Diagnosis Date Noted  . Bipolar disorder, curr episode mixed, severe, with psychotic features (HCC) [F31.64] 06/19/2016  . Opioid dependence in controlled environment (HCC) [F11.20] 06/19/2016  . Mild benzodiazepine use disorder [F13.10] 06/19/2016    Total Time spent with patient: 30 minutes  Musculoskeletal: Strength & Muscle Tone: within normal limits Gait & Station: normal Patient leans: N/A  Psychiatric Specialty Exam: Review of Systems  All other systems reviewed and are negative.   Blood pressure 137/84, pulse (!) 105, temperature 98.8 F (37.1 C), temperature source Oral, resp. rate 18, height 5' 10.75" (1.797 m), weight 77.1 kg (170 lb), SpO2 100 %.Body mass index is 23.88 kg/m.  General Appearance: Casual  Eye Contact::  Fair  Speech:  Clear and Coherent409  Volume:  Normal  Mood:  Anxious  Affect:  Appropriate  Thought Process:  Goal Directed and Descriptions of Associations: Circumstantial  Orientation:  Other:  person, situation, time  Thought Content:  Delusions  Suicidal Thoughts:  No  Homicidal Thoughts:  No  Memory:  Immediate;   Fair Recent;   Fair Remote;   Fair  Judgement:  Fair  Insight:  Shallow  Psychomotor Activity:  Normal  Concentration:  Fair  Recall:  [employer]Fair  Fund of Knowledge:Fair  Language: Fair  Akathisia:  No  Handed:  Right  AIMS (if indicated):     Assets:  Desire for Improvement  Sleep:  Number of Hours: 2.5  Cognition: WNL  ADL's:  Intact   Mental Status Per Nursing Assessment::   On Admission:  NA  Demographic Factors:  Male  Loss Factors: NA  Historical Factors: Impulsivity  Risk Reduction Factors:   Positive social support  Continued Clinical Symptoms:  Previous Psychiatric Diagnoses and Treatments  Cognitive Features That  Contribute To Risk:  None    Suicide Risk:  Minimal: No identifiable suicidal ideation.  Patients presenting with no risk factors but with morbid ruminations; may be classified as minimal risk based on the severity of the depressive symptoms  Follow-up Information    Charles CashingMCKENZIE, WAYLAND, MD Follow up on 06/25/2016.   Specialty:  Family Medicine Why:  Friday at 10:15.  You will get your blood drawn to get a Lithium level check. Contact information: [address] [place] [address] [phone]        [facility].   Specialty:  Behavioral Health Why:  Go to the walk-in clinic M-F between 8 and 11AM within the next 7 days for your hospital follow up appointment.  This is where you will see a psychiatrist for your mental health medications Contact information: [address] [place] [address] [phone]           Plan Of Care/Follow-up recommendations: Spoke to wife - Charles Dominguez - who wanted to know the list of medications the patient was on and wanted to take patient home since she feels he will do better at home. Explained to wife that patient was on abilify, lithium and what he may have experienced could have been an orthostatic hypotension - likely medication induced. Discussed that patient continued to have communications and urges to go up to the roof yesterday and that was why his Abilify was increased. Pt also was not sleeping at night and patient felt Ambien might help and hence it was added, but he never  received it due to the incident that happened yesterday ( fall) . Also discussed that since patient was taking xanax and norco prior to admission - he was started on a tapering dose of ativan for withdrawal sx of BZD as well as clonidine prn - for opioid withdrawal sx. Discussed that ativan has been tapered down and this is usually done to prevent withdrawal sx as well as seizures.  Wife feels patient should be discharged since she feels his sx can be  managed at home , she also reports she can keep him safe at home since she has support from other family members as well as a dog. Wife also reports he sleeps only 3 hrs at night since a long time and she is not worried about him not sleeping.  Discussed wife's concern with patient - pt reports he is not too preoccupied with his delusions and he feels better and that he is ready to go home.  Patient not to resume Norco /xanax on discharge - since it likely induced or exacerbated his mood sx as well as psychosis which resulted in his current admission.   Activity:  no restrictions Diet:  regular Tests:  Dierdre Searles level to be drawn tomorrow 06/24/16- was scheduled for today - was not drawn.  Other:  follow up with aftercare  Kemesha Mosey, MD 06/23/2016, 11:15 AM

## 2016-06-23 NOTE — ED Notes (Signed)
Pt given turkey sandwich

## 2016-06-23 NOTE — Discharge Summary (Signed)
Physician Discharge Summary Note  Patient:  Charles Dominguez is an 51 y.o., male MRN:  [id number] DOB:  07-03-1965 Patient phone:  [phone] (home)   Patient address:   [address] [place] [address],  Total Time spent with patient: Greater than 30 minutes  Date of Admission:  06/18/2016  Date of Discharge: 06-24-99  Reason for Admission: Worsening symptoms of Bipolar disorder with drug induced psychosis.  Principal Problem: Bipolar disorder, curr episode mixed, severe, with psychotic features [facility])  Discharge Diagnoses: Patient Active Problem List   Diagnosis Date Noted  . Bipolar disorder, curr episode mixed, severe, with psychotic features (HCC) [F31.64] 06/19/2016  . Opioid dependence in controlled environment (HCC) [F11.20] 06/19/2016  . Mild benzodiazepine use disorder [F13.10] 06/19/2016   Past Psychiatric History: Hx. Bipolar disorder, Opioid use disorder, Benzodiazepine use disorder.  Past Medical History:  Past Medical History:  Diagnosis Date  . Back muscle spasm   . Shoulder pain     Past Surgical History:  Procedure Laterality Date  . RETINAL TEAR REPAIR CRYOTHERAPY     both eyes has surgery  . SHOULDER SURGERY     Family History:  Family History  Problem Relation Age of Onset  . Bipolar disorder Mother   . Bipolar disorder Maternal Aunt    Family Psychiatric  History: See H&P  Social History:  History  Alcohol Use No     History  Drug Use No    Social History   Social History  . Marital status: Married    Spouse name: N/A  . Number of children: N/A  . Years of education: N/A   Social History Main Topics  . Smoking status: Never Smoker  . Smokeless tobacco: Never Used  . Alcohol use No  . Drug use: No  . Sexual activity: Yes    Birth control/ protection: None   Other Topics Concern  . None   Social History Narrative  . None   Hospital Course: Frederickis a 51 y.o.AA male, who is married, unemployed, who lives in  [place] with his wife , who has a hx of bipolar mood sx as well as psychosis , presented to [facility] with worsening sx.  Pt today reports worsening mood sx since the past several week. Pt reports that he goes through phases of "annoying period" as well as phases of "depression". Pt reports that he has been feeling sad, anxious , racing thoughts , and has been having SI. Pt reports that he took some unisom , Xanax and Norco to feel better , but then his wife called 911. Pt reports that he has been having these thoughts that there is this mole on his hand that has been transmitting communications to the back of his ears and these communications are coming from [place]. Pt also reports there is a drone flying right over his hand . Pt reports sleep issues since the past several days. Pt reports he has been to ED in the past several times , but denies IP admissions. Pt also denies past OP follow up. Pt reports suicide attempt 5 months ago when he OD sed on unisom pills, but he did not go to a hospital, just felt drowsy for a while.  Charles Dominguez was admitted to the [facility] adult unit for mood stabilization, crisis management & drug detoxification treatments due to worsening symptoms of Bipolar disorder & possibly substance induced psychosis. After evaluation of his symptoms, he was treated with medications with their indications & side effects explained to  him. His other pre-existing medical problems were identified & treated accordingly by resuming his home medications as deemed appropriate. He received drug detoxification treatments using Ativan detox protocols.  Besides, the detox treatment, Charles Dominguez was also medicated & discharged on; Abilify 5 mg for mood control, Benztropine 0.5 mg for EPS, Gabapentin 400 mg for agitation, Lithium Carbonate 300 mg for mood stabilization & trazodone 100 mg for insomnia. During the course of his hosptalization, Charles Dominguez's improvement was monitored by observation & his daily report of symptom  reduction noted.  His emotional & mental status were monitored by daily self inventory reports completed by him & the clinical staff. He reported continued improvement on daily basis & denied any new concerns. He was encouraged to attend groups to help with recognizing triggers of his emotional crises & ways to cope better with them. One of the triggers of his emotional crisis (psychosis) was probably induced by his abuse of benzodiazepine & Opioid. It is apparent that [facility] to not be prescribed any controlled substance (opioid & benzodiazepine) as our assessment revealed his psychotic symptoms to be delusional.       During the course of his hospitalization, Charles Dominguez was evaluated by the treatment team on daily basis for mood stability & plans for continued recovery after discharge. He was offered further treatment options upon discharge on an outpatient basis as noted below.  He was encouraged to maintain satisfactory support network & stable home environment. He was instructed & encouraged to adhere to his medication regimen as recommended by his treatment team & abstain from narcotic/controlled substance.    Charles Dominguez's motivation was an integral factor in his mood stability.  Upon discharge, he was both mentally and medically stable denying suicidal/homicidal ideation, auditory/visual/tactile hallucinations, delusional thoughts and paranoia. He left [facility] with all personal belongings in no distress.  Transportation per his arrangement (wife).   Physical Findings:  AIMS: Facial and Oral Movements Muscles of Facial Expression: None, normal Lips and Perioral Area: None, normal Jaw: None, normal Tongue: None, normal,Extremity Movements Upper (arms, wrists, hands, fingers): None, normal Lower (legs, knees, ankles, toes): None, normal, Trunk Movements Neck, shoulders, hips: None, normal, Overall Severity Severity of abnormal movements (highest score from questions above): None, normal Incapacitation  due to abnormal movements: None, normal Patient's awareness of abnormal movements (rate only patient's report): No Awareness, Dental Status Current problems with teeth and/or dentures?: No Does patient usually wear dentures?: No  CIWA:  CIWA-Ar Total: 0 COWS:  COWS Total Score: 4  Musculoskeletal: Strength & Muscle Tone: within normal limits Gait & Station: normal Patient leans: N/A  Psychiatric Specialty Exam: Physical Exam  Constitutional: He is oriented to person, place, and time. He appears well-developed.  HENT:  Head: Normocephalic.  Eyes: Pupils are equal, round, and reactive to light.  Neck: Normal range of motion.  Cardiovascular: Normal rate.   Respiratory: Effort normal.  GI: Soft.  Genitourinary:  Genitourinary Comments: Deferred  Musculoskeletal: Normal range of motion.  Neurological: He is alert and oriented to person, place, and time.  Skin: Skin is warm and dry.    Review of Systems  Constitutional: Negative.   HENT: Negative.   Eyes: Negative.   Respiratory: Negative.   Cardiovascular: Negative.   Gastrointestinal: Negative.   Genitourinary: Negative.   Musculoskeletal: Negative.   Skin: Negative.   Neurological: Negative.   Endo/Heme/Allergies: Negative.   Psychiatric/Behavioral: Positive for depression, hallucinations ( Hx. Psychosis.) and substance abuse (Opioid use disorder, dependence). Negative for memory loss. Suicidal ideas: Stable. The  patient has insomnia. The patient is not nervous/anxious.     Blood pressure 132/86, pulse 100, temperature 98.8 F (37.1 C), temperature source Oral, resp. rate 18, height 5' 10.75" (1.797 m), weight 77.1 kg (170 lb), SpO2 100 %.Body mass index is 23.88 kg/m.  See Md's SRA   Have you used any form of tobacco in the last 30 days? (Cigarettes, Smokeless Tobacco, Cigars, and/or Pipes): No  Has this patient used any form of tobacco in the last 30 days? (Cigarettes, Smokeless Tobacco, Cigars, and/or Pipes):   No  Blood Alcohol level:  Lab Results  Component Value Date   ETH <5 06/17/2016   Metabolic Disorder Labs:  Lab Results  Component Value Date   HGBA1C 5.3 06/20/2016   MPG 105 06/20/2016   Lab Results  Component Value Date   PROLACTIN 9.6 06/20/2016   Lab Results  Component Value Date   CHOL 229 (H) 06/20/2016   TRIG 163 (H) 06/20/2016   HDL 45 06/20/2016   CHOLHDL 5.1 06/20/2016   VLDL 33 06/20/2016   LDLCALC 151 (H) 06/20/2016   See Psychiatric Specialty Exam and Suicide Risk Assessment completed by Attending Physician prior to discharge.  Discharge destination:  Home  Is patient on multiple antipsychotic therapies at discharge:  No   Has Patient had three or more failed trials of antipsychotic monotherapy by history:  No  Recommended Plan for Multiple Antipsychotic Therapies: NA  Discharge Instructions    Discharge instructions    Complete by:  As directed    FYI: It is very important that you have your Lithium level drawn prior to taking your morning dose of your Lithium Carbonate tomorrow morning (06-24-2016).   Discharge instructions    Complete by:  As directed    FYI: For outpatient provider: Justin Dominguez presented to the hospital with psychosis possibly induced by abuse of Benzodiazepines. As a result, should not be prescribed narcotic.     Allergies as of 06/23/2016   No Known Allergies     Medication List    STOP taking these medications   diphenhydrAMINE 50 MG capsule Commonly known as:  BENADRYL   HYDROcodone-acetaminophen 10-325 MG tablet Commonly known as:  NORCO     TAKE these medications     Indication  ARIPiprazole 5 MG tablet Commonly known as:  ABILIFY Take 1 tablet (5 mg total) by mouth 2 (two) times daily in the am and at bedtime.. For mood control  Indication:  Mood control   benztropine 0.5 MG tablet Commonly known as:  COGENTIN Take 1 tablet (0.5 mg total) by mouth 2 (two) times daily. For prevention of drug induced tremors   Indication:  Extrapyramidal Reaction caused by Medications   gabapentin 400 MG capsule Commonly known as:  NEURONTIN Take 1 capsule (400 mg total) by mouth 3 (three) times daily. For agitation  Indication:  Agitation   lidocaine 5 % Commonly known as:  LIDODERM Place 1 patch onto the skin daily. Remove & Discard patch within 12 hours or as directed by MD: For pain management  Indication:  Pain management   lithium carbonate 300 MG capsule Take 1 capsule (300 mg total) by mouth 2 (two) times daily with a meal. For mood stabilization  Indication:  Mood stabilization   traZODone 100 MG tablet Commonly known as:  DESYREL Take 2 tablets (200 mg total) by mouth at bedtime. For insomnia  Indication:  Trouble Sleeping      Follow-up Information    Billee Cashing, MD Follow  up on 06/25/2016.   Specialty:  Family Medicine Why:  Friday at 10:15.  You will get your blood drawn to get a Lithium level check. Contact information: [address] [place] [address] [phone]        [facility]   Specialty:  Behavioral Health Why:  Go to the walk-in clinic M-F between 8 and 11AM within the next 7 days for your hospital follow up appointment.  This is where you will see a psychiatrist for your mental health medications Contact information: [address] [place] [address] [phone]          Follow-up recommendations: Activity:  As tolerated Diet: As recommended by your primary care doctor. Keep all scheduled follow-up appointments as recommended.   Comments: Patient is instructed prior to discharge to: Take all medications as prescribed by his/her mental healthcare provider. Report any adverse effects and or reactions from the medicines to his/her outpatient provider promptly. Patient has been instructed & cautioned: To not engage in alcohol and or illegal drug use while on prescription medicines. In the event of worsening symptoms, patient is instructed  to call the crisis hotline, 911 and or go to the nearest ED for appropriate evaluation and treatment of symptoms. To follow-up with his/her primary care provider for your other medical issues, concerns and or health care needs.   Signed: Sanjuana KavaNwoko, Jjesus Dingley I, NP, PMHNP, FNP-BC 06/24/2016, 4:17 PM

## 2016-06-23 NOTE — Discharge Instructions (Signed)
Your labs today showed a little bit of kidney function elevation (which can often be due to dehydration, or new medications) and I expect that to go back to normal. Otherwise your labs were unremarkable and your chest x-ray and EKG looked good. The stitches will fall out on their own over the next week. Make sure you drink plenty of water to stay hydrated and try to eat small meals throughout the day, even if you don[facility]. Return to the ER for new or worsening symptoms.

## 2016-06-23 NOTE — Progress Notes (Signed)
  [facility] :  Will you be returning to the same living situation after discharge:  Yes,  home At discharge, do you have transportation home?: Yes,  wife Do you have the ability to pay for your medications: Yes,  mental health  Release of information consent forms completed and in the chart;  Patient's signature needed at discharge.  Patient to Follow up at: Follow-up Information    Billee CashingMCKENZIE, WAYLAND, MD Follow up on 06/25/2016.   Specialty:  Family Medicine Why:  Friday at 10:15.  You will get your blood drawn to get a Lithium level check. Contact information: [address] [place] [address] [phone]        [facility].   Specialty:  Behavioral Health Why:  Go to the walk-in clinic M-F between 8 and 11AM within the next 7 days for your hospital follow up appointment.  This is where you will see a psychiatrist for your mental health medications Contact information: [address] [place] [address] [phone]           Next level of care provider has access to [facility] Link:no  Safety Planning and Suicide Prevention discussed: Yes,  yes  Have you used any form of tobacco in the last 30 days? (Cigarettes, Smokeless Tobacco, Cigars, and/or Pipes): No  Has patient been referred to the Quitline?: N/A patient is not a smoker  Patient has been referred for addiction treatment: N/A  Ida RogueRodney B Stryker Veasey 06/23/2016, 11:13 AM

## 2016-06-23 NOTE — Plan of Care (Signed)
Problem: [facility] Participation in Recreation Therapeutic Interventions Goal: STG-Patient will identify at least five coping skills for ** STG: Coping Skills - Patient will be able to identify at least 5 coping skills for anxiety by conclusion of recreation therapy tx  Outcome: Completed/Met Date Met: 06/23/16 Pt was able to identify coping skills at the conclusion of coping skills recreation therapy session.  Victorino Sparrow, LRT/CTRS

## 2016-06-23 NOTE — Tx Team (Signed)
Interdisciplinary Treatment and Diagnostic Plan Update  06/23/2016 Time of Session: 11:07 AM  Charles Dominguez MRN: [id number]  Principal Diagnosis: Bipolar disorder, curr episode mixed, severe, with psychotic features ([place])  Secondary Diagnoses: Principal Problem:   Bipolar disorder, curr episode mixed, severe, with psychotic features ([place]) Active Problems:   Opioid dependence in controlled environment ([place])   Mild benzodiazepine use disorder   Current Medications:  Current Facility-Administered Medications  Medication Dose Route Frequency Provider Last Rate Last Dose  . alum & mag hydroxide-simeth (MAALOX/MYLANTA) 200-200-20 MG/5ML suspension 30 mL  30 mL Oral PRN Patrecia Pour, NP      . ARIPiprazole (ABILIFY) tablet 5 mg  5 mg Oral BH-qamhs Saramma Eappen, MD      . benztropine (COGENTIN) tablet 0.5 mg  0.5 mg Oral BID Patrecia Pour, NP   0.5 mg at 06/23/16 6213  . dicyclomine (BENTYL) tablet 20 mg  20 mg Oral Q6H PRN Benjamine Mola, FNP      . diphenhydrAMINE (BENADRYL) capsule 25 mg  25 mg Oral Q8H PRN Ursula Alert, MD   25 mg at 06/23/16 0239   Or  . diphenhydrAMINE (BENADRYL) injection 25 mg  25 mg Intramuscular Q8H PRN Ursula Alert, MD      . gabapentin (NEURONTIN) capsule 400 mg  400 mg Oral TID Kerrie Buffalo, NP   400 mg at 06/23/16 0865  . haloperidol (HALDOL) tablet 5 mg  5 mg Oral Q8H PRN Ursula Alert, MD   5 mg at 06/23/16 0240   Or  . haloperidol lactate (HALDOL) injection 5 mg  5 mg Intramuscular Q8H PRN Ursula Alert, MD      . ibuprofen (ADVIL,MOTRIN) tablet 600 mg  600 mg Oral TID PRN Ursula Alert, MD      . lidocaine (LIDODERM) 5 % 1 patch  1 patch Transdermal Q24H Kerrie Buffalo, NP   1 patch at 06/22/16 1658  . lithium carbonate capsule 300 mg  300 mg Oral BID WC Ursula Alert, MD   300 mg at 06/23/16 0828  . magnesium hydroxide (MILK OF MAGNESIA) suspension 30 mL  30 mL Oral Daily PRN Patrecia Pour, NP      . methocarbamol (ROBAXIN)  tablet 500 mg  500 mg Oral Q8H PRN Benjamine Mola, FNP      . multivitamin with minerals tablet 1 tablet  1 tablet Oral Daily Ursula Alert, MD   1 tablet at 06/23/16 0828  . thiamine (VITAMIN B-1) tablet 100 mg  100 mg Oral Daily Ursula Alert, MD   100 mg at 06/23/16 0828  . traZODone (DESYREL) tablet 200 mg  200 mg Oral QHS Ursula Alert, MD   200 mg at 06/21/16 2051    PTA Medications: Prescriptions Prior to Admission  Medication Sig Dispense Refill Last Dose  . diphenhydrAMINE (BENADRYL) 50 MG capsule Take 100 mg by mouth at bedtime.   06/16/2016 at Unknown time  . HYDROcodone-acetaminophen (NORCO) 10-325 MG tablet Take 1 tablet by mouth four times a day as needed  0     Treatment Modalities: Medication Management, Group therapy, Case management,  1 to 1 session with clinician, Psychoeducation, Recreational therapy.   Physician Treatment Plan for Primary Diagnosis: Bipolar disorder, curr episode mixed, severe, with psychotic features ([place]) Long Term Goal(s): Improvement in symptoms so as ready for discharge  Short Term Goals: Ability to verbalize feelings will improve   Medication Management: Evaluate patient's response, side effects, and tolerance of medication regimen.  Therapeutic Interventions:  1 to 1 sessions, Unit Group sessions and Medication administration.  Evaluation of Outcomes: Adequate for Discharge  Physician Treatment Plan for Secondary Diagnosis: Principal Problem:   Bipolar disorder, curr episode mixed, severe, with psychotic features ([place]) Active Problems:   Opioid dependence in controlled environment ([place])   Mild benzodiazepine use disorder   Long Term Goal(s): Improvement in symptoms so as ready for discharge  Short Term Goals: Compliance with prescribed medications will improve  Medication Management: Evaluate patient's response, side effects, and tolerance of medication regimen.  Therapeutic Interventions: 1 to 1 sessions, Unit Group sessions and  Medication administration.  Evaluation of Outcomes: Adequate for Discharge   RN Treatment Plan for Primary Diagnosis: Bipolar disorder, curr episode mixed, severe, with psychotic features ([place]) Long Term Goal(s): Knowledge of disease and therapeutic regimen to maintain health will improve  Short Term Goals: Ability to identify and develop effective coping behaviors will improve and Compliance with prescribed medications will improve  Medication Management: RN will administer medications as ordered by provider, will assess and evaluate patient's response and provide education to patient for prescribed medication. RN will report any adverse and/or side effects to prescribing provider.  Therapeutic Interventions: 1 on 1 counseling sessions, Psychoeducation, Medication administration, Evaluate responses to treatment, Monitor vital signs and CBGs as ordered, Perform/monitor CIWA, COWS, AIMS and Fall Risk screenings as ordered, Perform wound care treatments as ordered.  Evaluation of Outcomes: Adequate for Discharge   Recreational Therapy Treatment Plan for Primary Diagnosis: Bipolar disorder, curr episode mixed, severe, with psychotic features ([place]) Long Term Goal(s):  LTG- Patient will participate in recreation therapy tx in at least 2 group sessions without prompting from LRT.  Short Term Goals:  Patient will be able to identify at least 5 coping skills for admitting dx by conclusion of recreation therapy tx.  Treatment Modalities: Group and Pet Therapy  Therapeutic Interventions: Psychoeducation  Evaluation of Outcomes: Adequate for Discharge   LCSW Treatment Plan for Primary Diagnosis: Bipolar disorder, curr episode mixed, severe, with psychotic features ([place]) Long Term Goal(s): Safe transition to appropriate next level of care at discharge, Engage patient in therapeutic group addressing interpersonal concerns.  Short Term Goals: Engage patient in aftercare planning with referrals and  resources  Therapeutic Interventions: Assess for all discharge needs, 1 to 1 time with Social worker, Explore available resources and support systems, Assess for adequacy in community support network, Educate family and significant other(s) on suicide prevention, Complete Psychosocial Assessment, Interpersonal group therapy.  Evaluation of Outcomes: Met  Return home, follow up outpt   Progress in Treatment: Attending groups: Yes Participating in groups: Yes Taking medication as prescribed: Yes Toleration medication: Yes, no side effects reported at this time Family/Significant other contact made: No Patient understands diagnosis: Yes AEB asking for help with depression; limited insight into psychosis Discussing patient identified problems/goals with staff: Yes Medical problems stabilized or resolved: Yes Denies suicidal/homicidal ideation: Yes Issues/concerns per patient self-inventory: None Other: N/A  New problem(s) identified: None identified at this time.   New Short Term/Long Term Goal(s): None identified at this time.   Discharge Plan or Barriers:   Reason for Continuation of Hospitalization:   Estimated Length of Stay: D/C today  Attendees: Patient: 06/23/2016  11:07 AM  Physician: Ursula Alert, MD 06/23/2016  11:07 AM  Nursing: Hoy Register, RN 06/23/2016  11:07 AM  RN Care Manager: Lars Pinks, RN 06/23/2016  11:07 AM  Social Worker: Ripley Fraise 06/23/2016  11:07 AM  Recreational Therapist: Laretta Bolster  06/23/2016  11:07  AM  Other: Norberto Sorenson 06/23/2016  11:07 AM  Other:  06/23/2016  11:07 AM    Scribe for Treatment Team:  Roque Lias LCSW 06/23/2016 11:07 AM

## 2016-06-23 NOTE — Progress Notes (Signed)
D: Pt A & O X3. Denies SI, HI, AH and pain when assessed. Reports VH and tactile hallucinations of bugs crawling on his left hand last night. Pt D/C home as ordered and was picked up by his daughter in the main lobby. A: D/C instructions reviewed with pt including medication samples, prescriptions and follow up appointment as per protocol. Scheduled medications administered to pt as prescribed. Emotional support and availability provided to pt. Encouraged pt to voice concerns, comply with d/c instructions and treatment regimen. Fall precaution maintained without incident. All belongings in locker 37 returned to pt prior to departure from facility. Q 15 minutes safety checks continues without outburst or self harm gestures.  R: Pt receptive to care. Verbalized understanding related to d/c instructions. Signed belonging sheet in agreement with items received. Pt ambulatory with a steady gait; appears to be in no physical distress at time of departure.

## 2016-07-08 ENCOUNTER — Encounter (HOSPITAL_COMMUNITY): Payer: Self-pay

## 2016-07-08 ENCOUNTER — Emergency Department (HOSPITAL_COMMUNITY)
Admission: EM | Admit: 2016-07-08 | Discharge: 2016-07-08 | Disposition: A | Discharge status: Home / Self Care | Payer: Self-pay | Attending: Dermatology | Admitting: Dermatology

## 2016-07-08 DIAGNOSIS — R0602 Shortness of breath: Secondary | ICD-10-CM | POA: Insufficient documentation

## 2016-07-08 DIAGNOSIS — Z5321 Procedure and treatment not carried out due to patient leaving prior to being seen by health care provider: Secondary | ICD-10-CM | POA: Insufficient documentation

## 2016-07-08 DIAGNOSIS — Z79899 Other long term (current) drug therapy: Secondary | ICD-10-CM | POA: Insufficient documentation

## 2016-07-08 NOTE — ED Triage Notes (Signed)
Pt endorses difficulty breathing after taking new bipolar medications prescribed today. Pt took abilify, norco and ativan and then became short of breath. Pt able to speak in complete sentences. Pt states he is anxious and has a hx of anxiety attacks. spo2 100%

## 2016-07-08 NOTE — ED Notes (Signed)
Called pt name three times to be roomed. No response. 

## 2016-07-08 NOTE — ED Notes (Signed)
Called pt name three times for room. No response.

## 2016-09-08 ENCOUNTER — Encounter (HOSPITAL_COMMUNITY): Payer: Self-pay | Admitting: *Deleted

## 2016-09-08 ENCOUNTER — Emergency Department (HOSPITAL_COMMUNITY)
Admission: EM | Admit: 2016-09-08 | Discharge: 2016-09-08 | Disposition: A | Discharge status: Home / Self Care | Payer: Medicaid Other | Attending: Emergency Medicine | Admitting: Emergency Medicine

## 2016-09-08 DIAGNOSIS — Z79899 Other long term (current) drug therapy: Secondary | ICD-10-CM | POA: Insufficient documentation

## 2016-09-08 DIAGNOSIS — Z760 Encounter for issue of repeat prescription: Secondary | ICD-10-CM | POA: Insufficient documentation

## 2016-09-08 MED ORDER — HYDROCODONE-ACETAMINOPHEN 5-325 MG PO TABS
2.0000 | ORAL_TABLET | Freq: Once | ORAL | Status: AC
  Administered 2016-09-08: 2 via ORAL
  Filled 2016-09-08: qty 2

## 2016-09-08 NOTE — Discharge Instructions (Signed)
You will need to follow-up with your doctor for refill of your pain medicine.

## 2016-09-08 NOTE — ED Triage Notes (Signed)
PT states that he has ran out of his Hydrocodone for back pain and cannot get to Dr. Thea Silversmith office cause all roads are blocked to his office

## 2016-09-08 NOTE — ED Provider Notes (Signed)
[facility]-EMERGENCY DEPT Provider Note   CSN: [id number] Arrival date & time: 09/08/16  1022  By signing my name below, I, Majel Homer, attest that this documentation has been prepared under the direction and in the presence of [employer], PA-C . Electronically Signed: Majel Homer, Scribe. 09/08/2016. 11:02 AM.  History   Chief Complaint Chief Complaint  Patient presents with  . Medication Refill   The history is provided by the patient. No language interpreter was used.   HPI Comments: Charles Dominguez is a 51 y.o. male with PMHx of back muscle spasms, who presents to the Emergency Department requesting a refill of Hydrocodone for his chronic back pain. Pt reports he recently ran out of this medication but has been unable to visit his PCP, Dr. Thea Silversmith, as all the roads are blocked off to office. He notes he has also attempted to call office but the phone lines have been down in his area. He denies any fever, nausea, vomiting, or difficulty urinating.   Past Medical History:  Diagnosis Date  . Back muscle spasm   . Shoulder pain    Patient Active Problem List   Diagnosis Date Noted  . Bipolar disorder, curr episode mixed, severe, with psychotic features (HCC) 06/19/2016  . Opioid dependence in controlled environment (HCC) 06/19/2016  . Mild benzodiazepine use disorder 06/19/2016   Past Surgical History:  Procedure Laterality Date  . RETINAL TEAR REPAIR CRYOTHERAPY     both eyes has surgery  . SHOULDER SURGERY      Home Medications    Prior to Admission medications   Medication Sig Start Date End Date Taking? Authorizing Provider  ARIPiprazole (ABILIFY) 5 MG tablet Take 1 tablet (5 mg total) by mouth 2 (two) times daily in the am and at bedtime.. For mood control 06/23/16   Sanjuana Kava, NP  benztropine (COGENTIN) 0.5 MG tablet Take 1 tablet (0.5 mg total) by mouth 2 (two) times daily. For prevention of drug induced tremors 06/23/16   Sanjuana Kava, NP  gabapentin  (NEURONTIN) 400 MG capsule Take 1 capsule (400 mg total) by mouth 3 (three) times daily. For agitation 06/23/16   Sanjuana Kava, NP  lidocaine (LIDODERM) 5 % Place 1 patch onto the skin daily. Remove & Discard patch within 12 hours or as directed by MD: For pain management 06/23/16   Sanjuana Kava, NP  lithium carbonate 300 MG capsule Take 1 capsule (300 mg total) by mouth 2 (two) times daily with a meal. For mood stabilization 06/23/16   Sanjuana Kava, NP  traZODone (DESYREL) 100 MG tablet Take 2 tablets (200 mg total) by mouth at bedtime. For insomnia 06/23/16   Sanjuana Kava, NP    Family History Family History  Problem Relation Age of Onset  . Bipolar disorder Mother   . Bipolar disorder Maternal Aunt     Social History Social History  Substance Use Topics  . Smoking status: Never Smoker  . Smokeless tobacco: Never Used  . Alcohol use No   Allergies   Patient has no known allergies.  Review of Systems Review of Systems  Constitutional: Negative for chills and fever.  Gastrointestinal: Negative for nausea and vomiting.  Genitourinary: Negative for difficulty urinating.  Musculoskeletal: Positive for back pain.   Physical Exam Updated Vital Signs BP (!) 139/104 (BP Location: Right Arm)   Pulse 78   Temp 98.3 F (36.8 C) (Oral)   Resp 18   Ht  (1.803 m)  Wt 190 lb (86.2 kg)   SpO2 99%   BMI 26.50 kg/m   Physical Exam  Constitutional: He is oriented to person, place, and time. He appears well-developed and well-nourished. No distress.  Awake, alert and nontoxic in appearance  HENT:  Head: Normocephalic and atraumatic.  Right Ear: External ear normal.  Left Ear: External ear normal.  Mouth/Throat: Oropharynx is clear and moist.  Eyes: Conjunctivae and EOM are normal. Pupils are equal, round, and reactive to light.  Neck: Normal range of motion. No JVD present.  Cardiovascular: Normal rate, regular rhythm and normal heart sounds.   Pulmonary/Chest: Effort normal  and breath sounds normal. No stridor.  Abdominal: Soft. He exhibits no distension. There is no tenderness.  Musculoskeletal: Normal range of motion. He exhibits no tenderness.  Neurological: He is alert and oriented to person, place, and time.  Awake, alert, cooperative and aware of situation; moves all extremities spontaneously with appropriate coordination no facial asymmetry; tongue midline; major cranial nerves appear intact;  baseline gait without new ataxia.  Skin: No rash noted. He is not diaphoretic.  Psychiatric: He has a normal mood and affect. His behavior is normal. Thought content normal.  Nursing note and vitals reviewed.  ED Treatments / Results  DIAGNOSTIC STUDIES:  Oxygen Saturation is 99% on RA, normal by my interpretation.    COORDINATION OF CARE:  10:59 AM Discussed treatment plan with pt at bedside and pt agreed to plan.  Labs (all labs ordered are listed, but only abnormal results are displayed) Labs Reviewed - No data to display  EKG  EKG Interpretation None       Radiology No results found.  Procedures Procedures (including critical care time)  Medications Ordered in ED Medications  HYDROcodone-acetaminophen (NORCO/VICODIN) 5-325 MG per tablet 2 tablet (2 tablets Oral Given 09/08/16 1107)    Initial Impression / Assessment and Plan / ED Course  I have reviewed the triage vital signs and the nursing notes.  Pertinent labs & imaging results that were available during my care of the patient were reviewed by me and considered in my medical decision making (see chart for details).     Pain managed in ED. Discussed need to follow-up PCP for refill of narcotic medication. No evidence of other emergent pathology at this time. Appears well and appropriate for outpatient follow-up.   I personally performed the services described in this documentation, which was scribed in my presence. The recorded information has been reviewed and is accurate.   Final  Clinical Impressions(s) / ED Diagnoses   Final diagnoses:  Encounter for medication refill    New Prescriptions Discharge Medication List as of 09/08/2016 11:01 AM       Joycie Peek, PA-C 09/08/16 1121    Azalia Bilis, MD 09/08/16 [phone]

## 2017-10-07 IMAGING — CR DG CHEST 2V
2 series · 2 of 2 positions shown · non-contrast
Comparison: Chest radiograph performed 06/20/2014

CLINICAL DATA: Status post syncope and fall. Injury to the right
anterior lower chest. Initial encounter.

EXAM:
CHEST  2 VIEW

[chest pa]
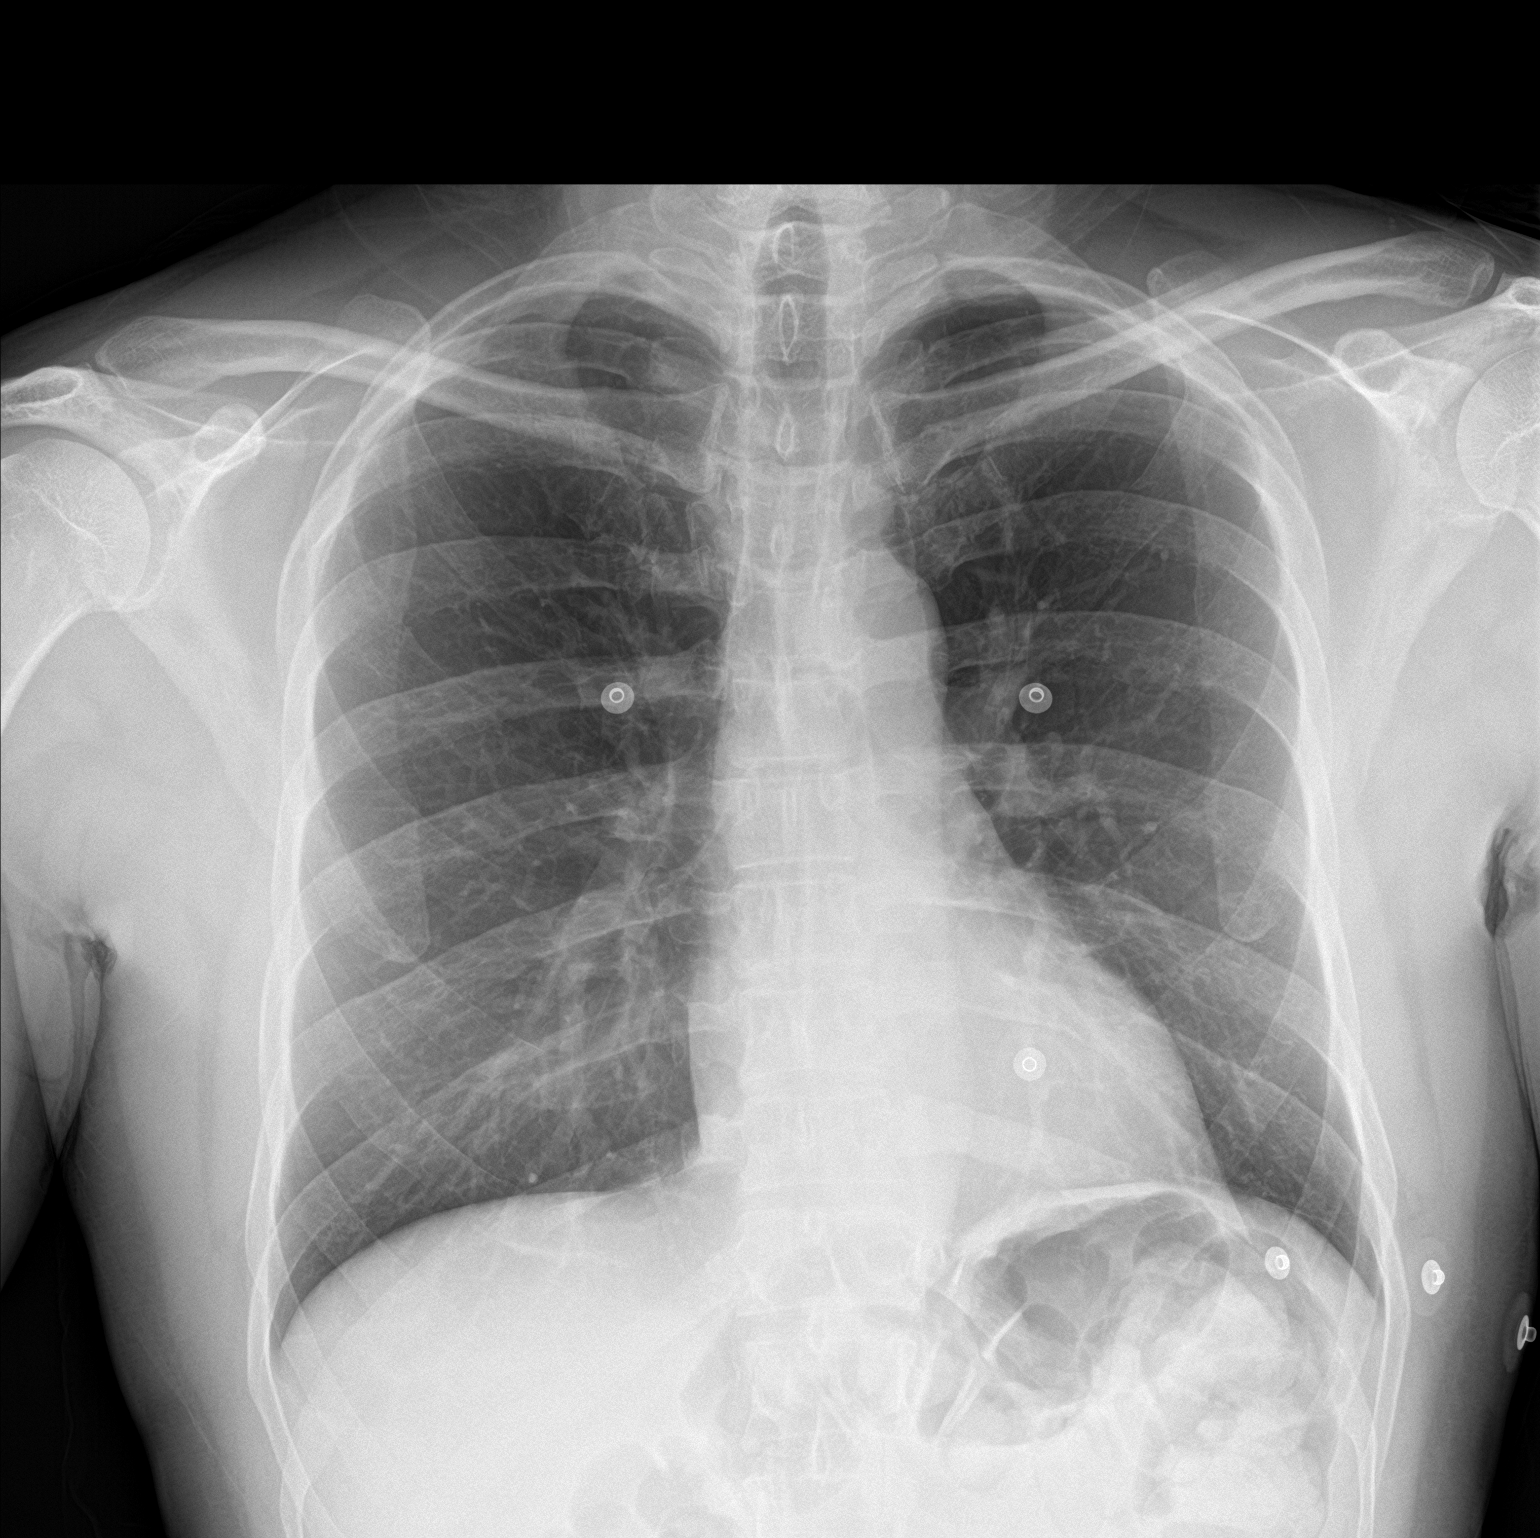

[chest lat]
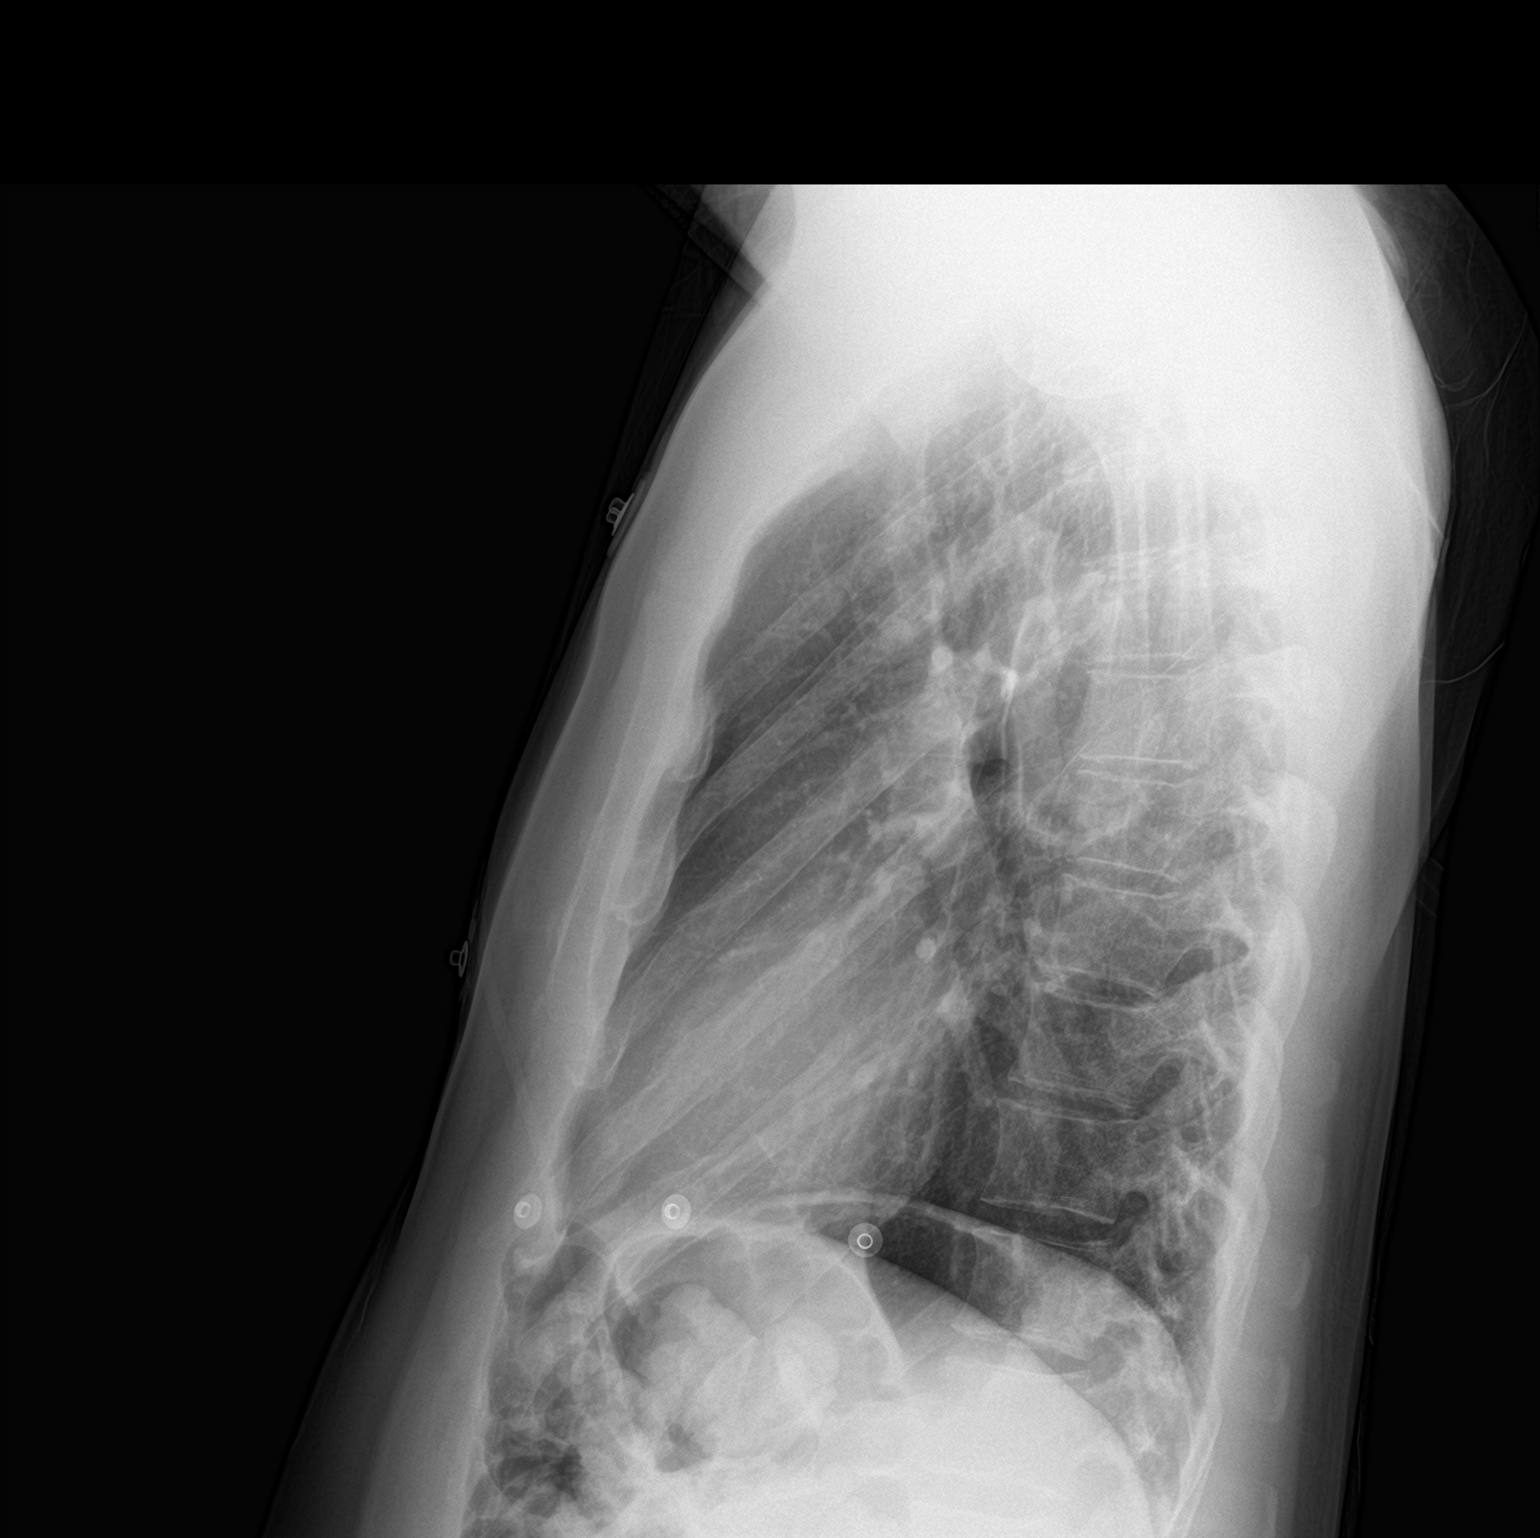

[2 of 2 positions shown; findings below may reference images not displayed]

FINDINGS: The lungs are well-aerated and clear. There is no evidence of focal
opacification, pleural effusion or pneumothorax.

The heart is normal in size; the mediastinal contour is within
normal limits. No acute osseous abnormalities are seen.
IMPRESSION: No acute cardiopulmonary process seen. No displaced rib fractures
identified.

## 2018-07-07 ENCOUNTER — Ambulatory Visit (INDEPENDENT_AMBULATORY_CARE_PROVIDER_SITE_OTHER): Payer: Self-pay | Admitting: Orthopaedic Surgery

## 2018-07-10 ENCOUNTER — Ambulatory Visit (INDEPENDENT_AMBULATORY_CARE_PROVIDER_SITE_OTHER): Payer: Self-pay | Admitting: Orthopaedic Surgery
